# Patient Record
Sex: Female | Born: 1935
Health system: Southern US, Community
[De-identification: ages and names within clinical notes are randomized; demographics above are authoritative.]

## PROBLEM LIST (undated history)

## (undated) DIAGNOSIS — G2581 Restless legs syndrome: Secondary | ICD-10-CM

## (undated) DIAGNOSIS — I639 Cerebral infarction, unspecified: Secondary | ICD-10-CM

## (undated) DIAGNOSIS — M1990 Unspecified osteoarthritis, unspecified site: Secondary | ICD-10-CM

## (undated) DIAGNOSIS — K219 Gastro-esophageal reflux disease without esophagitis: Secondary | ICD-10-CM

## (undated) DIAGNOSIS — G459 Transient cerebral ischemic attack, unspecified: Secondary | ICD-10-CM

## (undated) DIAGNOSIS — E7800 Pure hypercholesterolemia, unspecified: Secondary | ICD-10-CM

## (undated) DIAGNOSIS — I10 Essential (primary) hypertension: Secondary | ICD-10-CM

## (undated) HISTORY — DX: Pure hypercholesterolemia, unspecified: E78.00

## (undated) HISTORY — DX: Restless legs syndrome: G25.81

## (undated) HISTORY — DX: Gastro-esophageal reflux disease without esophagitis: K21.9

## (undated) HISTORY — DX: Cerebral infarction, unspecified: I63.9

## (undated) HISTORY — PX: JOINT REPLACEMENT: SHX530

## (undated) HISTORY — DX: Essential (primary) hypertension: I10

## (undated) HISTORY — DX: Transient cerebral ischemic attack, unspecified: G45.9

## (undated) HISTORY — DX: Unspecified osteoarthritis, unspecified site: M19.90

---

## 2010-07-12 HISTORY — PX: TOTAL HIP ARTHROPLASTY: SHX124

## 2011-06-16 DIAGNOSIS — I639 Cerebral infarction, unspecified: Secondary | ICD-10-CM

## 2011-06-16 HISTORY — DX: Cerebral infarction, unspecified: I63.9

## 2011-06-19 ENCOUNTER — Encounter: Payer: Self-pay | Admitting: *Deleted

## 2011-06-19 ENCOUNTER — Inpatient Hospital Stay (HOSPITAL_COMMUNITY)
Admission: RE | Admit: 2011-06-19 | Discharge: 2011-07-03 | DRG: 945 | Disposition: A | Discharge status: Home Health | Payer: Medicare Other | Source: Ambulatory Visit | Attending: Physical Medicine & Rehabilitation | Admitting: Physical Medicine & Rehabilitation

## 2011-06-19 DIAGNOSIS — E785 Hyperlipidemia, unspecified: Secondary | ICD-10-CM

## 2011-06-19 DIAGNOSIS — M1990 Unspecified osteoarthritis, unspecified site: Secondary | ICD-10-CM

## 2011-06-19 DIAGNOSIS — E7800 Pure hypercholesterolemia, unspecified: Secondary | ICD-10-CM

## 2011-06-19 DIAGNOSIS — G2581 Restless legs syndrome: Secondary | ICD-10-CM

## 2011-06-19 DIAGNOSIS — Z5189 Encounter for other specified aftercare: Principal | ICD-10-CM

## 2011-06-19 DIAGNOSIS — N289 Disorder of kidney and ureter, unspecified: Secondary | ICD-10-CM

## 2011-06-19 DIAGNOSIS — R1310 Dysphagia, unspecified: Secondary | ICD-10-CM

## 2011-06-19 DIAGNOSIS — I6330 Cerebral infarction due to thrombosis of unspecified cerebral artery: Secondary | ICD-10-CM

## 2011-06-19 DIAGNOSIS — Z836 Family history of other diseases of the respiratory system: Secondary | ICD-10-CM

## 2011-06-19 DIAGNOSIS — Z8249 Family history of ischemic heart disease and other diseases of the circulatory system: Secondary | ICD-10-CM

## 2011-06-19 DIAGNOSIS — G4700 Insomnia, unspecified: Secondary | ICD-10-CM

## 2011-06-19 DIAGNOSIS — I639 Cerebral infarction, unspecified: Secondary | ICD-10-CM

## 2011-06-19 DIAGNOSIS — I6350 Cerebral infarction due to unspecified occlusion or stenosis of unspecified cerebral artery: Secondary | ICD-10-CM

## 2011-06-19 DIAGNOSIS — K219 Gastro-esophageal reflux disease without esophagitis: Secondary | ICD-10-CM

## 2011-06-19 DIAGNOSIS — I10 Essential (primary) hypertension: Secondary | ICD-10-CM

## 2011-06-19 DIAGNOSIS — G8190 Hemiplegia, unspecified affecting unspecified side: Secondary | ICD-10-CM

## 2011-06-19 LAB — URINALYSIS, ROUTINE W REFLEX MICROSCOPIC
Leukocytes, UA: NEGATIVE
Nitrite: NEGATIVE
Specific Gravity, Urine: 1.023 (ref 1.005–1.030)
Urobilinogen, UA: 0.2 mg/dL (ref 0.0–1.0)
pH: 5.5 (ref 5.0–8.0)

## 2011-06-19 LAB — MRSA PCR SCREENING: MRSA by PCR: NEGATIVE

## 2011-06-19 MED ORDER — PROMETHAZINE HCL 12.5 MG RE SUPP: 12.5000 mg | Freq: Four times a day (QID) | RECTAL | Status: DC | PRN

## 2011-06-19 MED ORDER — PANTOPRAZOLE SODIUM 40 MG PO TBEC
40.0000 mg | DELAYED_RELEASE_TABLET | Freq: Every day | ORAL | Status: DC
  Administered 2011-06-20 – 2011-07-03 (×14): 40 mg via ORAL
  Filled 2011-06-19 (×18): qty 1

## 2011-06-19 MED ORDER — FLEET ENEMA 7-19 GM/118ML RE ENEM
1.0000 | ENEMA | Freq: Once | RECTAL | Status: AC | PRN
  Filled 2011-06-19: qty 1

## 2011-06-19 MED ORDER — ACETAMINOPHEN 325 MG PO TABS
325.0000 mg | ORAL_TABLET | ORAL | Status: DC | PRN
Start: 2011-06-19 — End: 2011-07-03
  Administered 2011-06-22 – 2011-06-23 (×2): 650 mg via ORAL
  Filled 2011-06-19 (×3): qty 2

## 2011-06-19 MED ORDER — PROMETHAZINE HCL 12.5 MG PO TABS: 12.5000 mg | ORAL_TABLET | Freq: Four times a day (QID) | ORAL | Status: DC | PRN

## 2011-06-19 MED ORDER — METOPROLOL TARTRATE 25 MG/10 ML ORAL SUSPENSION
50.0000 mg | Freq: Every day | ORAL | Status: DC
  Filled 2011-06-19: qty 20

## 2011-06-19 MED ORDER — MUSCLE RUB 10-15 % EX CREA
TOPICAL_CREAM | CUTANEOUS | Status: DC | PRN
  Filled 2011-06-19: qty 85

## 2011-06-19 MED ORDER — LISINOPRIL 20 MG PO TABS
20.0000 mg | ORAL_TABLET | Freq: Every day | ORAL | Status: DC
  Administered 2011-06-20 – 2011-07-02 (×13): 20 mg via ORAL
  Filled 2011-06-19 (×16): qty 1

## 2011-06-19 MED ORDER — FOOD THICKENER (THICKENUP CLEAR)
2.0000 | ORAL | Status: DC | PRN
  Filled 2011-06-19: qty 2.8

## 2011-06-19 MED ORDER — SIMVASTATIN 20 MG PO TABS
20.0000 mg | ORAL_TABLET | Freq: Every day | ORAL | Status: DC
  Filled 2011-06-19: qty 1

## 2011-06-19 MED ORDER — TRAZODONE HCL 50 MG PO TABS
25.0000 mg | ORAL_TABLET | Freq: Every evening | ORAL | Status: DC | PRN
  Administered 2011-06-20 – 2011-06-23 (×3): 25 mg via ORAL
  Administered 2011-06-24: 50 mg via ORAL
  Filled 2011-06-19 (×4): qty 1

## 2011-06-19 MED ORDER — BISACODYL 10 MG RE SUPP: 10.0000 mg | Freq: Every day | RECTAL | Status: DC | PRN

## 2011-06-19 MED ORDER — GUAIFENESIN-DM 100-10 MG/5ML PO SYRP: 5.0000 mL | ORAL_SOLUTION | Freq: Four times a day (QID) | ORAL | Status: DC | PRN

## 2011-06-19 MED ORDER — AZELASTINE HCL 0.1 % NA SOLN
1.0000 | Freq: Two times a day (BID) | NASAL | Status: DC
  Administered 2011-06-20 – 2011-07-02 (×24): 1 via NASAL
  Filled 2011-06-19: qty 30

## 2011-06-19 MED ORDER — CLOPIDOGREL BISULFATE 75 MG PO TABS
75.0000 mg | ORAL_TABLET | Freq: Every day | ORAL | Status: DC
  Administered 2011-06-20 – 2011-07-03 (×14): 75 mg via ORAL
  Filled 2011-06-19 (×15): qty 1

## 2011-06-19 MED ORDER — POLYETHYLENE GLYCOL 3350 17 G PO PACK
17.0000 g | PACK | Freq: Every day | ORAL | Status: DC | PRN
  Filled 2011-06-19: qty 1

## 2011-06-19 MED ORDER — ALUM & MAG HYDROXIDE-SIMETH 200-200-20 MG/5ML PO SUSP: 30.0000 mL | ORAL | Status: DC | PRN

## 2011-06-19 MED ORDER — BIOTENE DRY MOUTH MT LIQD
15.0000 mL | Freq: Two times a day (BID) | OROMUCOSAL | Status: DC
  Administered 2011-06-19 – 2011-07-03 (×23): 15 mL via OROMUCOSAL

## 2011-06-19 MED ORDER — ALUM & MAG HYDROXIDE-SIMETH 400-400-40 MG/5ML PO SUSP: 30.0000 mL | ORAL | Status: DC | PRN

## 2011-06-19 MED ORDER — SENNA 8.6 MG PO TABS
2.0000 | ORAL_TABLET | Freq: Every day | ORAL | Status: DC
  Administered 2011-06-20 – 2011-07-02 (×13): 17.2 mg via ORAL
  Filled 2011-06-19 (×14): qty 2

## 2011-06-19 MED ORDER — SIMVASTATIN 40 MG PO TABS
40.0000 mg | ORAL_TABLET | Freq: Every day | ORAL | Status: DC
  Administered 2011-06-19 – 2011-07-02 (×14): 40 mg via ORAL
  Filled 2011-06-19 (×15): qty 1

## 2011-06-19 MED ORDER — ROPINIROLE HCL 0.25 MG PO TABS
0.2500 mg | ORAL_TABLET | Freq: Every evening | ORAL | Status: DC | PRN
  Administered 2011-06-19: 0.25 mg via ORAL
  Filled 2011-06-19: qty 1

## 2011-06-19 MED ORDER — ENOXAPARIN SODIUM 40 MG/0.4ML ~~LOC~~ SOLN
40.0000 mg | SUBCUTANEOUS | Status: DC
  Administered 2011-06-19 – 2011-07-02 (×14): 40 mg via SUBCUTANEOUS
  Filled 2011-06-19 (×15): qty 0.4

## 2011-06-19 MED ORDER — ROPINIROLE HCL 0.25 MG PO TABS
0.2500 mg | ORAL_TABLET | Freq: Every day | ORAL | Status: DC
  Filled 2011-06-19: qty 1

## 2011-06-19 MED ORDER — PROMETHAZINE HCL 25 MG/ML IJ SOLN: 12.5000 mg | Freq: Four times a day (QID) | INTRAMUSCULAR | Status: DC | PRN

## 2011-06-19 MED ORDER — HYDROCHLOROTHIAZIDE 12.5 MG PO CAPS
12.5000 mg | ORAL_CAPSULE | Freq: Every day | ORAL | Status: DC
  Administered 2011-06-20 – 2011-07-03 (×14): 12.5 mg via ORAL
  Filled 2011-06-19 (×16): qty 1

## 2011-06-19 MED ORDER — DIPHENHYDRAMINE HCL 12.5 MG/5ML PO ELIX
12.5000 mg | ORAL_SOLUTION | Freq: Four times a day (QID) | ORAL | Status: DC | PRN
  Filled 2011-06-19: qty 10

## 2011-06-19 NOTE — PMR Pre-admission (Signed)
[facility] PMR Admission Coordinator Pre-Admission Assessment  Patient Details Name: Kimberly Lester Date of Birth: Nov 09, 1930 Height:  5\' 2"  (157.5 cm) Weight:  68.493 kg (151 lb)  Insurance Information: HMO:X    PRIMARY:AARP MCR Complete      Policy#:[id number]      Subscriber:patient CM Name:Kimberly Lester      Phone#:[phone]     UJW#:[phone] Pre-Cert#:[id number]      Employer:Retired Benefits:  Phone #[phone]     Name:melinda Eff. Date:05/21/11     Deduct:0      Out of Pocket Max:$3900/ $0 met      Life XLK:GMWNU CIR:$220/day 1-7; $0/day 8-      SNF:$50/day 1-20; $150/day 21-40; $0/day 41-100 Outpatient:     Co-Pay:$40/visit Home Health:!00%      Co-Pay:0% DME:80%     Co-Pay:20% Providers:in network   Patients Current Diet:  Mechanical Soft; nectar thick liquids  Current Medical History:   Patient Admitting Diagnosis: L-CVA History of Present Illness: 78yo admitted to [facility] on 06/16/11 after on and off 2 day history of mild-right sided weakness with significant dysarthria and difficulty walking. She has a history of a right hip replacement done on 07/12/10 at [facility] in [place]. Patient admitted with diagnoses of TIA. After CT and MRI done, patient positive for a Left non hemorrhagic paracentral pontine infarct. Carotid ultrasound shows bilateral carotid bifurcation and proximal ICA plaque resulting in less than 50% diameter stenosis. Patient with dysphagia noted and MBSS done by SLP on 06/18/11 with recommendation for Mechanical soft and nectar thick liquids. Patient continues to work with therapies. NIH Stroke scale: unknown at outside hospital   Prior Rehab/Hospitalizations: none per patient   Medications   Current Medications: No current outpatient prescriptions on file.  Past Medical History: Significant for HTN, hypercholesterolemia, GERD and osteoarthritis of the digits. Also has had recent R-THR on 07/12/10 Family History:  mother positive for tuberculosis (deceased), father positive for emphysema and CAD  Precautions/Special Needs:   Precautions/Special Needs: Swallowing Swallowing Precautions: dysphagia  (MBSS done 06/18/11) Other Precautions: dysarthria Conditions/Impairments that will impact rehabilitation: Balance;Coordination;Dizziness;Other Balance Conditions/Impairments: lean noted with activity Coordination Conditions/Impairments: right sided weakness Dizziness Conditions/Impairments: when gettting up; history of vertigo Other Conditions/Impairments: dysphagia; dysarthria  Home Living:   Lives With: Spouse Receives Help From: Family Type of Home: House Home Layout: Two level;Full bath on main level;Able to live on main level with bedroom/bathroom Home Access: Stairs to enter Entrance Stairs-Rails: None Entrance Stairs-Number of Steps: 2 Bathroom Shower/Tub: Health visitor: Standard Bathroom Accessibility: Yes How Accessible: Accessible via walker Home Adaptive Equipment: Bedside commode/3-in-1;Walker - rolling  Prior Function:  Level of Independence: Independent with basic ADLs;Independent with gait;Independent with transfers Able to Take Stairs?: Yes Driving: Yes Vocation: Retired   Prior 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 Devices/Equipment:   Home Assistive Devices/Equipment: Environmental consultant (specify type)  Discharge Planning: Living Arrangements: Spouse/significant other Support Systems: Spouse/significant other;Children;Family members Assistance Needed: assistance at discharge Do you have any problems obtaining your medications?: No Type of Residence: Private residence Patient expects to be discharged to:: home Expected Discharge Date:  (to be determined) Case Management Consult Needed: Yes (Comment)   Prior Functional Level Current Functional Level  Bed Mobility  Independent  Min A   Transfers   Independent  Mod A   Mobility - Walk/Wheelchair  Independent  Min A RW 52ft x 2   Upper Body Dressing  Independent  min a  Lower Body Dressing  Independent  mod A   Grooming  Independent  set up   Eating/Drinking  Independent  set up   Toilet Transfer  Independent  Mod A   Bladder Continence   Independent  Independent   Bowel Management  Independent  Last BM 06/19/11   Stair Climbing  Independent  not assessed   Communication  Independent  mild dysarthria   Memory  Independent  WNL   Cooking/Meal Prep  Independent    Housework  Independent    Money Management  Independent    Driving  Independent     Prior Functional Levels:  Bed Mobility: Independent Transfers: Independent Mobility - Walk/Wheelchair: Independent Upper Body Dressing: Independent Lower Body Dressing: Independent Grooming: Independent Eating/Drinking: Independent Toilet Transfer: Independent Bladder Continence: Independent Bowel Management: Independent Stair Climbing: Independent Communication: Independent Memory: Independent Cooking/Meal Prep: Independent Housework: Independent Money Management: Independent Driving: Independent  Current Functional Levels: Bed Mobility: Min A Transfers: Mod A Mobility - Walk/Wheelchair: Min A RW 35ft x 2 Upper Body Dressing: min a Lower Body Dressing: mod A Grooming: set up Eating/Drinking: set up Toilet Transfer: Mod A Bladder Continence: Independent Bowel Management: Last BM 06/19/11 Stair Climbing: not assessed Communication: mild dysarthria Memory: WNL  Previous Home Environment:  Living Arrangements: Spouse/significant other Support Systems: Spouse/significant other;Children;Family members Assistance Needed: assistance at discharge Do you have any problems obtaining your medications?: No Type of Residence: Private residence Patient expects to be discharged to:: home Expected Discharge Date:  (to be determined) Home  Environment Number of Levels: 2 Previous Home Environment Number of Steps: 2 Previous Home Environment Is Bedroom on Main Floor?: Yes Previous Home Environment Is Bathroom on Main Floor?: Yes  Discharge Living Setting:  Plans for Discharge Living Setting: Patient's home;Lives with (comment);House (spouse) Discharge Living Setting Number of Levels: 2 Discharge Living Setting Number of Steps: 2 Discharge Living Setting is Bedroom on Main Floor?: Yes Discharge Living Setting is Bathroom on Main Floor?: Yes  Social/Family/Support Systems:   Patient Roles: Spouse;Parent;Caregiver Anticipated Caregiver: husband and daughters Anticipated Caregiver's Contact Information: Junious Silk [phone] cell, [phone] home Michaelle Copas [phone] cell,  home [phone]) Ability/Limitations of Caregiver: husband limited by COPD, dtr and granddtr to assist PRN Caregiver Availability: 24/7 Discharge Plan Discussed with Primary Caregiver: Yes Is Caregiver In Agreement with Plan?: Yes Does Caregiver/Family have Issues with Lodging/Transportation while Pt is in Rehab?: No  Goals/Additional Needs:  Patient/Family Goal for Rehab: to be home again as independent as possible Cultural Considerations: none Dietary Needs: Mechanical Soft Nectar thick Equipment Needs: to be determined Pt/Family Agrees to Admission and willing to participate: Yes Program Orientation Provided & Reviewed with Pt/Caregiver Including Roles  & Responsibilities: Yes  Preadmission Screen Completed By:  Oletta Darter, 06/19/2011 12:58 PM  Preadmission Screen Completed by Toni Amend on 06/19/11 @ 1315.      Discussed status with Dr. Riley Kill on1/30/13 at 0800 (time/date) and received telephone approval for admission today.  Admission Coordinator:  Oletta Darter, time 8657 /Date1/30/13  Co-signed by:    Marland Kitchen

## 2011-06-19 NOTE — Plan of Care (Addendum)
Overall Plan of Care [facility]) Patient Details Name: Selina Tapper MRN: [id number] DOB: 01/19/1931  Diagnosis:  Rehab for CVA  Primary Diagnosis:  L pontine infarct with R spastic hemi   Co-morbidities: HTN,dyslipidemia  Functional Problem List  Patient demonstrates impairments in the following areas: Balance, Bladder, Bowel, Edema, Endurance, Medication Management, Motor, Nutrition, Pain, Safety and Skin Integrity  Basic ADL's: grooming, bathing, dressing and toileting Advanced ADL's: simple meal preparation  Transfers:  bed mobility, bed to chair, car and furniture Locomotion:  ambulation and stairs  Additional Impairments:  Functional use of upper extremity  Anticipated Outcomes Item Anticipated Outcome  Eating/Swallowing  Modified independent  Basic self-care  Occasional minimal assistance with two handed tasks, eg socks, bra  Tolieting  Mod I  Bowel/Bladder  Continent of bowel and bladder  Transfers  Mod I  Locomotion  supervision  Communication    Cognition    Pain  3 or less  Safety/Judgment  supervision  Other     Therapy Plan: PT Frequency: 1-2 X/day, 60-90 minutes 0T Frequency: 1-2 X/day, 60-90 minutes  SLP 1-2X/day, 60-90 minutes  Team Interventions: Item RN PT OT SLP SW TR Other  Self Care/Advanced ADL Retraining   X      Neuromuscular Re-Education  x X      Therapeutic Activities  x X      UE/LE Strength Training/ROM  x X      UE/LE Coordination Activities  x X      Visual/Perceptual Remediation/Compensation         DME/Adaptive Equipment Instruction  x X      Therapeutic Exercise  x X      Balance/Vestibular Training  x X      Patient/Family Education x x X x     Cognitive Remediation/Compensation         Functional Mobility Training  x X      Ambulation/Gait Training  x       Air traffic controller x   x     Speech/Language Facilitation         Bladder Management x        Bowel Management x        Disease Management/Prevention x        Pain Management x x       Medication Management x        Skin Care/Wound Management x        Splinting/Orthotics   X      Discharge Planning x  X  x    Psychosocial Support     x                       Team Discharge Planning: Destination:  Home Projected Follow-up:  PT, OT and Outpatient none for SLP Projected Equipment Needs:  Bedside Commode, Scientist, physiological, Patient/family involved in discharge planning:  Yes  MD ELOS: 10 Days Medical Rehab Prognosis:  Excellent Assessment: 78 yo female admitted for L pontine infarct causing R spastic Hemi and dysarthria, requires CIR level PT,OT,SLP, 24/7 Rehab Rn and daily MD visits to maximize recovery and minimize morbidity.

## 2011-06-19 NOTE — Progress Notes (Signed)
Pt admitted to from 4147 from [facility] via EMS accompanied by family shortly thereafter via private vehicle. Pt and family members oriented to rehab, bed controls, call light, rehab routine, white board, therapy schedule, stroke notebook for education, lead nursing, Safety video and plan, swallowing precautions, nectar thick liquids, must be supervised by staff only until seen by therapy,team conference on Wednesday, preferred name and butterfly wishes, please see CHL for details of assessment, MRSA PCR sent, placed on contact until results back, pt and family aware need U/A and C& S. To call when needs to void. Will continue to monitor. Roberts-VonCannon, Brandice Busser Elon Jester

## 2011-06-19 NOTE — H&P (Signed)
Physical Medicine and Rehabilitation Admission H&   Chief complaint: Slurred speech and right sided weakness.  HPI: Ms Kimberly Lester is a 78 year old right handed female with history of HTN, dyslipidemia, admitted to [facility] on 01/27 with slurred speech and right sided weakness with difficulty walking.  MRI brain done revealing acute non hemorrhagic left pontine infarct. Carotid dopplers with bilateral carotid bifurcation and proximal ICA plaque with less than 50% stenosis.  Patient placed on plavix for cva prophylaxis. Patient continues with slurred speech and right sided weakness-UE >LE. Blood pressures have been labile.   Review of Systems  HENT: Negative for hearing loss.   Eyes: Negative for blurred vision.  Respiratory: Negative for sputum production and shortness of breath.   Cardiovascular: Negative for chest pain.  Gastrointestinal: Positive for constipation. Negative for heartburn and nausea.  Genitourinary: Negative for urgency and frequency.  Musculoskeletal: Positive for myalgias.  Neurological: Positive for speech change and focal weakness. Negative for headaches.  Psychiatric/Behavioral: The patient has insomnia.        Due to restless legs.   Past Medical History  Diagnosis Date  . Osteoarthritis   . Restless leg syndrome   . Hypertension   . GERD (gastroesophageal reflux disease)   . Hypercholesterolemia    Past Surgical History  Procedure Date   C-Section   . Total hip arthroplasty 07/12/10    at [facility]   Family History  Problem Relation Age of Onset  . Tuberculosis Mother   . COPD Father   . Coronary artery disease Father    Social History:  Married. Independent and active PTA.  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 She oes not have a smoking history on file. She does not have any smokeless tobacco history on file. Does not use alcohol or illicit drugs.  Allergies:  Allergies  Allergen Reactions  . Ativan  Other (See Comments)    "makes her crazy"  . Percocet (Oxycodone-Acetaminophen) Nausea And Vomiting    No current facility-administered medications on file as of .    Outpatient prescriptions: Steroid nasal spray Aleve daily Lisinopril-Hctz 10/12.5 -daily Lopressor 50 mg - daily Omeprazole 40 mg - daily Simvastatin 40 mg - daily     Home:  One level home with 2 STE.   Functional History: Independent without assistive device.   Functional Status:  Mobility:  Min-mod assist for transfers Tendency to lean to the right and difficulty advancing RLE Min-mod assist for standing balance CGA-Min assist for ambulating 40 feet x 2 with rest breaks and unsteady gait.     ADL:  Requires increased time to utilize RUE with impaired fine motor coordination.   Cognition:     Labs:  ANA: NR    RPR: NR  HTSH:1.95  Chol: 169    LDL: 83   Trig: 250   H/H: 12.3/35.8    Na: 138  K:3.8   Cl:101  CO2: 28   BUN:10   Creat: 0.59  Glucose:94  Blood pressure 153/83, pulse 80, temperature 98.6 F (37 C), temperature source Oral, resp. rate 22, height 5\' 2"  (1.575 m), weight 68.8 kg (151 lb 10.8 oz), SpO2 94.00%.  Physical Exam  Constitutional: She is oriented to person, place, and time. She appears well-developed and well-nourished.  HENT:  Head: Normocephalic and atraumatic.  Eyes: Pupils are equal, round, and reactive to light.  Neck: Normal range of motion. Neck supple.  Cardiovascular: Normal rate  and regular rhythm.   Respiratory: Effort normal and breath sounds normal.  GI: Soft. Bowel sounds are normal.  Musculoskeletal: Normal range of motion.  Neurological: She is alert and oriented to person, place, and time.       Oriented to self, place, situation.  Follows commands without difficulties.  Speech with some hesitation and mild slurring occasionally. Right upper extremity strength is 2+ to 3 minus out of 5 proximally to 3+ out of 5 distally. Right lower extremity is to 2+  proximal to 3+/4/5 distally. Left upper lower extremity grossly 5 out of 5. No gross sensory deficits. Reflexes 1+. She may have a mild right central seventh but this is minimal. His good oral motor control. Obviously she is appropriate with good insight and awareness. She follows all simple commands. Memory is good.    No results found.  Post Admission Physician Evaluation: 1. Functional deficits secondary  to left pontine infarct. 2. Patient is admitted to receive collaborative, interdisciplinary care between the physiatrist, rehab nursing staff, and therapy team. 3. Patient's level of medical complexity and substantial therapy needs in context of that medical necessity cannot be provided at a lesser intensity of care such as a SNF. 4. Patient has experienced substantial functional loss from his/her baseline which was documented above under the "Functional History" and "Functional Status" headings.  Judging by the patient's diagnosis, physical exam, and functional history, the patient has potential for functional progress which will result in measurable gains while on inpatient rehab.  These gains will be of substantial and practical use upon discharge  in facilitating mobility and self-care at the household level. 5. Physiatrist will provide 24 hour management of medical needs as well as oversight of the therapy plan/treatment and provide guidance as appropriate regarding the interaction of the two. 6. 24 hour rehab nursing will assist with bladder management, bowel management, safety, skin/wound care, disease management, medication administration and patient education  and help integrate therapy concepts, techniques,education, etc. 7. PT will assess and treat for:  Lower extremity strength, gait, and her muscular reeducation, adaptive equipment.  Goals are: Modified independent to supervision. 8. OT will assess and treat for: Upper extremity strength, ADLs, neuromuscular reeducation, ADLs,  functional mobility.   Goals are: Modified independent to supervision. 9. SLP will assess and treat for: Speech intelligibility.  Goals are: Modified independent. 10. Case Management and Social Worker will assess and treat for psychological issues and discharge planning. 11. Team conference will be held weekly to assess progress toward goals and to determine barriers to discharge. 12.  Patient will receive at least 3 hours of therapy per day at least 5 days per week. 13. ELOS and Prognosis: 10 days excellent   Medical Problem List and Plan: 1.   DVT Prophylaxis/Anticoagulation: Pharmaceutical: Lovenox  2.  Pain Management: N/A.  Use aleve for arthritic pain.  Will use tylenol prn.  3.  Mood: family reports patient worries about a lot of things.  Currently seems to be handling things well.  Will monitor and have SW follow up for formal evaluation.  4.  HTN: Monitor with bid checks.  Has been labile per reports.  Continue current regimen of   5.  Dyslipidemia: continue zocor.  Will check LFTs in am.  6.  RLS:  Will add reqip.  Family reports it helped after hip surgery but prescription was never refilled.  7. GERD: Continue PPI.       Ivory Broad, MD 06/19/11  1750

## 2011-06-20 DIAGNOSIS — I6330 Cerebral infarction due to thrombosis of unspecified cerebral artery: Secondary | ICD-10-CM

## 2011-06-20 DIAGNOSIS — Z5189 Encounter for other specified aftercare: Secondary | ICD-10-CM

## 2011-06-20 LAB — COMPREHENSIVE METABOLIC PANEL
AST: 16 U/L (ref 0–37)
Alkaline Phosphatase: 68 U/L (ref 39–117)
BUN: 24 mg/dL — ABNORMAL HIGH (ref 6–23)
CO2: 25 mEq/L (ref 19–32)
Chloride: 106 mEq/L (ref 96–112)
Creatinine, Ser: 0.61 mg/dL (ref 0.50–1.10)
GFR calc non Af Amer: 85 mL/min — ABNORMAL LOW (ref >90)
Potassium: 3.7 mEq/L (ref 3.5–5.1)
Total Bilirubin: 0.4 mg/dL (ref 0.3–1.2)

## 2011-06-20 LAB — CBC
MCH: 30 pg (ref 26.0–34.0)
MCV: 88.5 fL (ref 78.0–100.0)
Platelets: 160 10*3/uL (ref 150–400)
RBC: 4.17 MIL/uL (ref 3.87–5.11)
RDW: 13 % (ref 11.5–15.5)

## 2011-06-20 LAB — DIFFERENTIAL
Basophils Absolute: 0 10*3/uL (ref 0.0–0.1)
Basophils Relative: 0 % (ref 0–1)
Eosinophils Absolute: 0.1 10*3/uL (ref 0.0–0.7)
Eosinophils Relative: 3 % (ref 0–5)
Lymphs Abs: 1.2 10*3/uL (ref 0.7–4.0)
Neutrophils Relative %: 62 % (ref 43–77)

## 2011-06-20 MED ORDER — ROPINIROLE HCL 0.5 MG PO TABS
0.5000 mg | ORAL_TABLET | Freq: Every day | ORAL | Status: DC
  Administered 2011-06-20 – 2011-07-02 (×13): 0.5 mg via ORAL
  Filled 2011-06-20 (×14): qty 1

## 2011-06-20 MED ORDER — METOPROLOL TARTRATE 50 MG PO TABS
50.0000 mg | ORAL_TABLET | Freq: Every day | ORAL | Status: DC
  Administered 2011-06-20 – 2011-07-02 (×13): 50 mg via ORAL
  Filled 2011-06-20 (×14): qty 1

## 2011-06-20 NOTE — Progress Notes (Signed)
INITIAL ADULT NUTRITION ASSESSMENT Date: 06/20/2011   Time: 8:53 AM  Reason for Assessment: Dysphagia  ASSESSMENT: Female 78 y.o.  Dx: s/p CVA  Hx:  Past Medical History  Diagnosis Date  . Osteoarthritis   . Restless leg syndrome   . Hypertension   . GERD (gastroesophageal reflux disease)   . Hypercholesterolemia    Related Meds:     . antiseptic oral rinse  15 mL Mouth Rinse BID  . azelastine  1 spray Each Nare BID  . clopidogrel  75 mg Oral Q breakfast  . enoxaparin  40 mg Subcutaneous Q24H  . hydrochlorothiazide  12.5 mg Oral Daily  . lisinopril  20 mg Oral Daily  . metoprolol tartrate  50 mg Oral Daily  . pantoprazole  40 mg Oral Q1200  . senna  2 tablet Oral QHS  . simvastatin  40 mg Oral q1800  . DISCONTD: metoprolol tartrate  50 mg Oral Daily  . DISCONTD: rOPINIRole  0.25 mg Oral QHS  . DISCONTD: simvastatin  20 mg Oral q1800   Ht: 5\' 2"  (157.5 cm)  Wt: 151 lb 10.8 oz (68.8 kg)  Ideal Wt: 50 kg % Ideal Wt: 138%  Usual Wt: unable to obtain % Usual Wt: n/a  Body mass index is 27.74 kg/(m^2). Pt meets criteria for overweight.  Food/Nutrition Related Hx: Regular diet PTA  Labs:  CMP     Component Value Date/Time   NA 142 06/20/2011 0600   K 3.7 06/20/2011 0600   CL 106 06/20/2011 0600   CO2 25 06/20/2011 0600   GLUCOSE 106* 06/20/2011 0600   BUN 24* 06/20/2011 0600   CREATININE 0.61 06/20/2011 0600   CALCIUM 9.5 06/20/2011 0600   PROT 6.4 06/20/2011 0600   ALBUMIN 3.4* 06/20/2011 0600   AST 16 06/20/2011 0600   ALT 18 06/20/2011 0600   ALKPHOS 68 06/20/2011 0600   BILITOT 0.4 06/20/2011 0600   GFRNONAA 85* 06/20/2011 0600   GFRAA >90 06/20/2011 0600   Intake/Output: I/O last 3 completed shifts: In: 840 [P.O.:840] Out: 500 [Urine:500]    Diet Order: Dysphagia 3, Nectar Thick Liquids  Supplements/Tube Feeding: none  IVF:    Estimated Nutritional Needs:   Kcal: 1450 - 1600 Protein:  75 - 85 grams Fluid:  1.5 - 1.7 L/d  Pt admitted from [facility], s/p CVA. Currently on Dysphagia 3 diet with Nectar Thickened liquids. Consumed 80% of dinner last evening. Eating well at this time. Pt denies any issues with appetite or recent wt loss. Family states that pt is eating less than she was at home, but pt states that she's eating until she is full and feels as though she is getting enough to eat. Discussed importance of hydration, pt states that she enjoys buttermilk.  NUTRITION DIAGNOSIS: -Swallowing difficulty (NI-1.1).  Status: Ongoing  RELATED TO: recent CVA  AS EVIDENCE BY: need for thickened liquids.  MONITORING/EVALUATION(Goals): Goal: Pt to consume >/= 90% of estimated needs with meals and supplements. Monitor: PO intake, weights, labs, I/O's, diet advancement  EDUCATION NEEDS: -No education needs identified at this time  INTERVENTION: 1. Pt eating well at this time. Discussed importance of hydration. Pt asked appropriate questions. 2. RD to follow nutrition care plan  Dietitian #: [phone]  DOCUMENTATION CODES Per approved criteria  -Not Applicable    Adair Laundry 06/20/2011, 8:53 AM

## 2011-06-20 NOTE — Progress Notes (Signed)
Subjective/Complaints: Legs kept her up last night. No complaints of SOB,CP,ABD pain.  Glad to be on water protocol.  Objective: Vital Signs: Blood pressure 144/83, pulse 76, temperature 98.5 F (36.9 C), temperature source Oral, resp. rate 19, height 5\' 2"  (1.575 m), weight 68.8 kg (151 lb 10.8 oz), SpO2 93.00%. No results found.  Basename 06/20/11 0600  WBC 4.9  HGB 12.5  HCT 36.9  PLT 160    Basename 06/20/11 0600  NA 142  K 3.7  CL 106  CO2 25  GLUCOSE 106*  BUN 24*  CREATININE 0.61  CALCIUM 9.5   CBG (last 3)  No results found for this basename: GLUCAP:3 in the last 72 hours  Wt Readings from Last 3 Encounters:  06/19/11 68.8 kg (151 lb 10.8 oz)  06/19/11 68.493 kg (151 lb)    Physical Exam:  General appearance: alert, cooperative and no distress Head: Normocephalic, without obvious abnormality, atraumatic Eyes: negative findings: lids and lashes normal Ears: normal TM and external ear canal right ear Nose: Nares normal. Septum midline. Mucosa normal. No drainage or sinus tenderness. Throat: normal findings: lips normal without lesions Neck: no adenopathy, no JVD and supple, symmetrical, trachea midline Back: no kyphosis present, symmetric, no curvature. ROM normal. No CVA tenderness. Resp: clear to auscultation bilaterally Cardio: regular rate and rhythm GI: soft, non-tender; bowel sounds normal; no masses,  no organomegaly Extremities: extremities normal, atraumatic, no cyanosis or edema Pulses: 2+ and symmetric Skin: Skin color, texture, turgor normal. No rashes or lesions Neurologic:    Assessment/Plan: 1. Functional deficits secondary to L pontine infarct with R hemiparesis which require 3+ hours per day of interdisciplinary therapy in a comprehensive inpatient rehab setting. Physiatrist is providing close team supervision and 24 hour management of active medical problems listed below. Physiatrist and rehab team continue to assess barriers to  discharge/monitor patient progress toward functional and medical goals. FIM: FIM - Bathing Bathing: 0: Activity did not occur  FIM - Upper Body Dressing/Undressing Upper body dressing/undressing: 0: Wears gown/pajamas-no public clothing FIM - Lower Body Dressing/Undressing Lower body dressing/undressing: 0: Wears gown/pajamas-no public clothing  FIM - Toileting Toileting steps completed by patient: Adjust clothing prior to toileting;Adjust clothing after toileting (gown) Toileting: 3: Mod-Patient completed 2 of 3 steps (assisted by grandaughter)  FIM - Diplomatic Services operational officer Devices: Bedside commode Toilet Transfers: 3-To toilet/BSC: Mod A (lift or lower assist)  FIM - Games developer Transfer: 0: Activity did not occur  FIM - Locomotion: Wheelchair Locomotion: Wheelchair: 0: Activity did not occur FIM - Locomotion: Ambulation Locomotion: Ambulation: 0: Activity did not occur  Comprehension Comprehension Mode: Auditory Comprehension: 5-Understands complex 90% of the time/Cues < 10% of the time  Expression Expression Mode: Verbal Expression: 5-Expresses complex 90% of the time/cues < 10% of the time  Social Interaction Social Interaction: 6-Interacts appropriately with others with medication or extra time (anti-anxiety, antidepressant).  Problem Solving Problem Solving: 5-Solves basic 90% of the time/requires cueing < 10% of the time  Memory Memory: 5-Recognizes or recalls 90% of the time/requires cueing < 10% of the time  Medical Problem List and Plan:  1. DVT Prophylaxis/Anticoagulation: Pharmaceutical: Lovenox   2. Pain Management: N/A. Use aleve for arthritic pain. Will use tylenol prn.   3. Mood: family reports patient worries about a lot of things. Currently seems to be handling things well. Will monitor and have SW follow up for formal evaluation.   4. HTN: Monitor with bid checks. Has been labile per reports.  Continue current  regimen of metoprolol, prinizide,  5. Dyslipidemia: continue zocor. Will check LFTs in am.   6. RLS: Increase  reqip. Family reports it helped after hip surgery but prescription didn't have refills.  7. GERD: Continue PPI.   LOS (Days) 1 A FACE TO FACE EVALUATION WAS PERFORMED  Love, Evlyn Kanner 06/20/2011, 10:34 AM

## 2011-06-20 NOTE — Progress Notes (Signed)
Physical Therapy Session Note  Patient Details  Name: Lashundra Shiveley MRN: [id number] Date of Birth: [birth year]  Today's Date: 06/20/2011 Time: 1600-1630 Time Calculation (min): 30 min  Precautions: Precautions Precautions: Fall Precaution Comments: Dys. 3 with nectar-thick liquids, right hemiplegia Required Braces or Orthoses: No Restrictions Weight Bearing Restrictions: No  Short Term Goals: PT Short Term Goal 1: Pt will maintain dynamic standing balance with min A for functional task PT Short Term Goal 1 - Progress: Progressing toward goal PT Short Term Goal 2: Pt will gait with LRAD 100' with min A in controlled environment PT Short Term Goal 2 - Progress: Progressing toward goal PT Short Term Goal 3: Pt will negotiate 2 stairs without handrails for home entry with mod A PT Short Term Goal 3 - Progress: Progressing toward goal  Skilled Therapeutic Interventions/Progress Updates:   General Chart Reviewed: Yes Family/Caregiver Present: Yes (dtr)  Pain Pain Assessment Pain Assessment: No/denies pain  Other Treatments  Standing dynamic pre-gait activities with mirror for visual feedback including lateral weight shifts, and stepping forward, backward, and sideways with left LE, focus on increased right hip and knee extension and increased right weight shift for sustained right stance components. Progressed to gait 50 feet x 2 with right hand held assist mod assist, manual facilitation at right hip to increase hip extension and anterior right weight shift in initial to mid stance phase.  Therapy/Group: Individual Therapy  Romeo Rabon 06/20/2011, 5:20 PM

## 2011-06-20 NOTE — Evaluation (Addendum)
Clinical/Bedside Swallow Evaluation Patient Details  Name: Kimberly Lester MRN: [id number] DOB: Oct 27, 1930 Today's Date: 06/20/2011 Time: 8119-1478  Past Medical History:  Past Medical History  Diagnosis Date  . Osteoarthritis   . Restless leg syndrome   . Hypertension   . GERD (gastroesophageal reflux disease)   . Hypercholesterolemia    Past Surgical History:  Past Surgical History  Procedure Date  . Total hip arthroplasty 07/12/10    at [place] [facility] [place] [facility]   HPI:  MBSS 06/18/11 at [facility]; Patient transferred to [facility] 06/19/11    Assessment/Recommendations/Treatment Plan  SLP Assessment Clinical Impression Statement: Patient presents with mild oral motor weakness and suspected pharyngeal weakness resulting in penetration and suspected aspiration, due to significance of coughing episode resulting in watery eyes, of thin liquids and prolonged mastication of regular textures.    Risk for Aspiration: Mild Other Related Risk Factors: History of GERD  Swallow Evaluation Recommendations Solid Consistency: Dysphagia 3 (Mechanical soft) Liquid Consistency: Nectar Liquid Administration via: Cup;No straw Medication Administration: Whole meds with puree Supervision: Patient able to self feed;Intermittent supervision to cue for compensatory strategies Compensations: Slow rate;Small sips/bites Postural Changes and/or Swallow Maneuvers: Seated upright 90 degrees Oral Care Recommendations: Oral care BID Other Recommendations: Order thickener from pharmacy;Prohibited food (jello, ice cream, thin soups);Remove water pitcher;Other (Comment) (water protocol) Follow up Recommendations: Other (comment) (TBD)  Treatment Plan Treatment Plan Recommendations: Therapy as outlined in treatment plan below Speech Therapy Frequency: min 5x/week Treatment Duration: 2 weeks Interventions: Aspiration precaution training;Compensatory techniques;Patient/family education;Trials of  upgraded texture/liquids;Diet toleration management by SLP;Group dysphagia treatment  Prognosis Prognosis for Safe Diet Advancement: Good  Individuals Consulted Consulted and Agree with Results and Recommendations: Patient;Family member/caregiver Family Member Consulted: husband  Swallowing Goals  SLP Swallowing Goals Patient will consume recommended diet without observed clinical signs of aspiration with: Modified independent assistance Patient will utilize recommended strategies during swallow to increase swallowing safety with: Modified independent assistance  Swallow Study Prior Functional Status  Type of Home: House Lives With: Spouse  General  Date of Onset: 06/16/11 HPI: MBSS 06/18/11 at [facility]; Patient transferred to CIR 06/19/11  Type of Study: Bedside swallow evaluation Diet Prior to this Study: Dysphagia 3 (soft);Nectar-thick liquids Temperature Spikes Noted: No Respiratory Status: Room air History of Intubation: No Behavior/Cognition: Alert;Cooperative;Pleasant mood Oral Cavity - Dentition: Dentures, top;Dentures, bottom Vision: Functional for self-feeding Patient Positioning: Upright in bed Baseline Vocal Quality: Clear Volitional Cough: Strong Volitional Swallow: Able to elicit  Oral Motor/Sensory Function  Overall Oral Motor/Sensory Function: Impaired Labial ROM: Reduced right Labial Symmetry: Abnormal symmetry right Labial Strength: Within Functional Limits Labial Sensation: Within Functional Limits Lingual ROM: Within Functional Limits Lingual Symmetry: Within Functional Limits Lingual Strength: Within Functional Limits Lingual Sensation: Within Functional Limits Facial ROM: Within Functional Limits Facial Symmetry: Within Functional Limits Facial Strength: Within Functional Limits Facial Sensation: Within Functional Limits  Consistency Results  Ice Chips Ice chips: Not tested  Thin Liquid Thin Liquid: Impaired Presentation: Cup;Self  Fed Pharyngeal  Phase Impairments: Multiple swallows;Delayed Swallow;Cough - Immediate;Cough - Delayed;Wet Vocal Quality;Other (comments) (watery eyes) Other Comments: patient with occassional mild delay in swallow initation which resulted in immediate cough response and intermittent cough througout trials  Nectar Thick Liquid Nectar Thick Liquid: Impaired Presentation: Cup Pharyngeal Phase Impairments: Multiple swallows  Honey Thick Liquid Honey Thick Liquid: Not tested  Puree Puree: Within functional limits Presentation: Spoon Other Comments: Observed RN administer medicaiton whole in puree   Solid Solid: Impaired Presentation: Self Fed Other  Comments: prolonged mastication, required liquid wash to reduce oral residue with delayed cough response  Fae Pippin, M.A., CCC-SLP [phone]  Anaiza Behrens 06/20/2011,2:33 PM

## 2011-06-20 NOTE — Progress Notes (Addendum)
Inpatient Rehabilitation Center Individual Statement of Services  Patient Name:  Kimberly Lester  Date:  06/20/2011  Welcome to the Inpatient Rehabilitation Center.  Our goal is to provide you with an individualized program based on your diagnosis and situation, designed to meet your specific needs.  With this comprehensive rehabilitation program, you will be expected to participate in at least 3 hours of rehabilitation therapies Monday-Friday, with modified therapy programming on the weekends.  Your rehabilitation program will include the following services:  Physical Therapy (PT), Occupational Therapy (OT), Speech Therapy (ST), 24 hour per day rehabilitation nursing, Therapeutic Recreaction (TR), Case Management (RN and Child psychotherapist), Rehabilitation Medicine, Nutrition Services and Pharmacy Services  Weekly team conferences will be held on  Wednesday  to discuss your progress.  Your RN Case Designer, television/film set will talk with you frequently to get your input and to update you on team discussions.  Team conferences with you and your family in attendance may also be held.  Estimated Length of Stay: 2-21/2 weeks                       Goals:  Min Assist-Supervision  Depending on your progress and recovery, your program may change.  Your RN Case Estate agent will coordinate services and will keep you informed of any changes.  Your RN Sports coach and SW names and contact numbers are listed  below.  The following services may also be recommended but are not provided by the Inpatient Rehabilitation Center:   Driving Evaluations  Home Health Rehabiltiation Services  Outpatient Rehabilitatation [facility]  Vocational Rehabilitation   Arrangements will be made to provide these services after discharge if needed.  Arrangements include referral to agencies that provide these services.  Your insurance has been verified to be:  AARP Medicare Complete Your primary doctor is:  Dr. Alinda Deem    Pertinent information will be shared with your doctor and your insurance company.  Case Manager: Lutricia Horsfall, [facility] [phone]  Social Worker:  Dossie Der, [place] [phone]  Information discussed with and copy given to patient by: Brock Ra, 06/20/2011, 4:59 PM

## 2011-06-20 NOTE — Progress Notes (Signed)
Social Work Assessment and Plan Assessment and Plan  Patient Name: Kimberly Lester  HQION'G Date: 06/20/2011  Problem List:  Patient Active Problem List  Diagnoses  . CVA (cerebral infarction)    Past Medical History:  Past Medical History  Diagnosis Date  . Osteoarthritis   . Restless leg syndrome   . Hypertension   . GERD (gastroesophageal reflux disease)   . Hypercholesterolemia     Past Surgical History:  Past Surgical History  Procedure Date  . Total hip arthroplasty 07/12/10    at [facility]    Discharge Planning  Discharge Planning Support Systems: Spouse/significant other;Children;Family members;Church/faith community;Friends/neighbors Assistance Needed: May require assist at discahrge Home Care Services: No  Social/Family/Support Systems    Employment Status Employment Status Employment Status: Retired Fish farm manager Issues: No issues Guardian/Conservator: None  Abuse/Neglect Abuse/Neglect Assessment (Assessment to be complete while patient is alone) Physical Abuse: Denies Verbal Abuse: Denies Sexual Abuse: Denies Exploitation of patient/patient's resources: Denies Self-Neglect: Denies  Emotional Status Emotional Status Pt's affect, behavior adn adjustment status: Pt is motivated and ready to work.  She will do whatever is asked of her.  Husband here and sleeping in the recliner chair.  She has done well with other health issues she is hopeful she will again, but realizes it may take time.  She reports: " I am a patient person." Recent Psychosocial Issues: Other health issues, recovered from hip replacement last year. Pyschiatric History: No history- deferred Beck Depression Screen due to coping appropriately with CVA and deficits.  Relies upon her faith to get through.  No substance issues.  Patient/Family Perceptions, Expectations & Goals Pt/Family Perceptions, Expectations and Goals Pt/Family understanding of  illness & functional limitations: Pt is able to explain her CVA and deficits.  She is motivated to improve and recover from this. She is encouraged by her movement she has gotten back already.  Husnband is here to observe in therapies. 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, retiree, church member, etc Anticipated changes in roles/activities/participation: Plans to resume these roles Pt/family expectations/goals: Pt states: " I want to be able to do for myself, my husband had breathing issues of his own."  Husband: " Our daughter's will help if necessary."  [employer] Stores: None Premorbid Home Care/DME Agencies: None Transportation available at discharge: Family Resource referrals recommended: Support group (specify) (CVA Support Group)  Discharge Visual merchandiser Resources: Media planner (specify) Investment banker, operational) Financial Resources: Restaurant manager, fast food Screen Referred: No Living Expenses: Lives with family Money Management: Patient;Spouse Home Management: pt, daughter's can asssit with this at discharge Patient/Family Preliminary Plans: Return home with husband and daughter's to assist. Pt hopes to be mod/i level at discharge.  Someone will always be there with pt.  Clinical Impression:  Pleasant female sitting up in bed ready to begin her therapies.  Husband sleeping in the chair.  Pt is fairly high level, should do well here. Supportive family who can arrange to be with a discharge.      Lucy Chris 06/20/2011

## 2011-06-20 NOTE — Evaluation (Signed)
Speech Language Pathology Assessment and Plan  Patient Details  Name: [place] Dodgen MRN: [id number] Date of Birth: June 14, 1930  SLP Diagnosis: none Rehab Potential: Good ELOS:  per OT and PT  Today's Date: 06/20/2011 Time: 0830-0900 Time Calculation (min): 30 min  Problem List:  Patient Active Problem List  Diagnoses  . CVA (cerebral infarction)    Past Medical History:  Past Medical History  Diagnosis Date  . Osteoarthritis   . Restless leg syndrome   . Hypertension   . GERD (gastroesophageal reflux disease)   . Hypercholesterolemia    Past Surgical History:  Past Surgical History  Procedure Date  . Total hip arthroplasty 07/12/10    at [facility] the [facility]    Assessment & Plan Clinical Impression: Patient is a 78 y.o. year old female with recent admission to [facility] on 06/16/11 with right sided weakness and slurred speech.  MRI revealed left pontine infarct.  Patient transferred to CIR on 06/19/2011 and is currently modified independent with all cognitive linguistic tasks assessed and family reports that a side from swallowing difficulty she is at baseline function.    SLP - End of Session Patient left: in bed;with call bell in reach;with family/visitor present Nurse Communication: Aspiration precautions reviewed;Diet recommendation;Swallow strategies reviewed Assessment SLP Recommendation/Assessment: Patient does not need any further Speech Lanaguage Pathology Services for cognitive-linguistic abilities No Skilled Speech Therapy: Patient is modified independent with all cognitive/linguistic skills Rehab Potential: Good Recommendation Follow up Recommendations: None Equipment Recommended: None recommended by SLP  SLP Evaluation Precautions/Restrictions  Precautions Precautions: Fall Precaution Comments: Dys.3 textures and nectar-thick liquids Required Braces or Orthoses: No Restrictions Weight Bearing Restrictions: No Pain Pain  Assessment Pain Assessment: No/denies pain Prior Functioning Cognitive/Linguistic Baseline: Within functional limits Type of Home: House Lives With: Spouse Receives Help From: Family Cognition Overall Cognitive Status: Appears within functional limits for tasks assessed Comprehension Auditory Comprehension Overall Auditory Comprehension: Appears within functional limits for tasks assessed Expression Expression Primary Mode of Expression: Verbal Verbal Expression Overall Verbal Expression: Appears within functional limits for tasks assessed Written Expression Dominant Hand: Right Written Expression: Unable to assess (comment) (due to upper extremity weakness) Oral/Motor Oral Motor/Sensory Function Overall Oral Motor/Sensory Function: Impaired Labial ROM: Reduced right Labial Symmetry: Abnormal symmetry right Labial Strength: Within Functional Limits Labial Sensation: Within Functional Limits Lingual ROM: Within Functional Limits Lingual Symmetry: Within Functional Limits Lingual Strength: Within Functional Limits Lingual Sensation: Within Functional Limits Facial ROM: Within Functional Limits Facial Symmetry: Within Functional Limits Facial Strength: Within Functional Limits Facial Sensation: Within Functional Limits Motor Speech Overall Motor Speech: Appears within functional limits for tasks assessed Articulation: Within functional limitis (modified independent )   Recommendations for other services: None  Discharge Criteria: Patient will be discharged from SLP if patient refuses treatment 3 consecutive times without medical reason, if treatment goals not met, if there is a change in medical status, if patient makes no progress towards goals or if patient is discharged from hospital.  The above assessment, treatment plan, treatment alternatives and goals were discussed and mutually agreed upon: by patient and by family  Charlane Ferretti.,  CCC-SLP [phone]  Latayna Ritchie 06/20/2011, 4:39 PM  .

## 2011-06-20 NOTE — Evaluation (Signed)
Physical Therapy Assessment and Plan  Patient Details  Name: Kimberly Lester MRN: [id number] Date of Birth: [birth year]  PT Diagnosis: Abnormal posture, Abnormality of gait, Hemiparesis dominant and Muscle weakness Rehab Potential: Good ELOS: 14 days   Today's Date: 06/20/2011 Time: 1100-1200 Time Calculation (min): 60 min  Assessment & Plan Clinical Impression: Patient is a 78 year old right handed female with history of HTN, dyslipidemia, admitted to [facility] on 01/27 with slurred speech and right sided weakness with difficulty walking. MRI brain done revealing acute non hemorrhagic left pontine infarct.   Patient transferred to CIR on 06/19/2011 .  Patient's past medical history is significant for OA, htn, GERD, high cholesterol, THA R hip 2/12.    Patient currently requires mod with mobility secondary to muscle weakness and abnormal tone, unbalanced muscle activation and decreased coordination.  Prior to hospitalization, patient was independent with mobility and lived with Spouse in a House home.  Home access is 2Stairs to enter.  Patient will benefit from skilled PT intervention to maximize safe functional mobility, minimize fall risk and decrease caregiver burden for planned discharge home with intermittent assist.  Anticipate patient will benefit from follow up OP at discharge.  PT - End of Session Activity Tolerance: Tolerates 30+ min activity with multiple rests Endurance Deficit: Yes Endurance Deficit Description: requires frequent rest breaks PT Assessment Rehab Potential: Good PT Plan PT Frequency: 1-2 X/day, 60-90 minutes Estimated Length of Stay: 14 days PT Treatment/Interventions: Ambulation/gait training;Balance/vestibular training;DME/adaptive equipment instruction;Functional mobility training;Neuromuscular re-education;Pain management;Patient/family education;Therapeutic Exercise;Therapeutic Activities;Stair training;UE/LE Strength taining/ROM;UE/LE Coordination  activities;Wheelchair propulsion/positioning PT Recommendation Follow Up Recommendations: Outpatient PT  Precautions/Restrictions Precautions Precautions: Fall Required Braces or Orthoses: No Restrictions Weight Bearing Restrictions: No Pain Pain Assessment Pain Assessment: No/denies pain Home Living/Prior Functioning Home Living Lives With: Spouse Receives Help From: Family Type of Home: House Home Layout: Two level;Able to live on main level with bedroom/bathroom Home Access: Stairs to enter Entrance Stairs-Rails: None Entrance Stairs-Number of Steps: 2 Home Adaptive Equipment: Walker - rolling Prior Function Level of Independence: Independent with basic ADLs;Independent with gait;Independent with transfers Able to Take Stairs?: Yes Driving: Yes  Cognition Overall Cognitive Status: Appears within functional limits for tasks assessed Sensation Sensation Light Touch: Appears Intact Proprioception: Appears Intact Motor  Motor Motor: Hemiplegia;Abnormal postural alignment and control Motor - Skilled Clinical Observations: R hemiparesis  Mobility Transfers Transfers: Yes Sit to Stand: 3: Mod assist Sit to Stand Details (indicate cue type and reason): cues at hips for fwd wt shift, manual assist to lift, verbal cues for hand placement Stand Pivot Transfers: 3: Mod assist Stand Pivot Transfer Details (indicate cue type and reason): without AD, cues for sequencing steps, cues for hand placment, manual assist to control descent Locomotion  Ambulation Ambulation: Yes Ambulation/Gait Assistance: 2: Max assist Ambulation Distance (Feet): 20 Feet Assistive device: None Ambulation/Gait Assistance Details (indicate cue type and reason): assist to prevent R LE buckling, Manual facilitation for wt shift, verbal cues to clear right foot in swing, assist at hips for stability and control Stairs / Additional Locomotion Stairs: Yes Stairs Assistance: 3: Mod assist Stairs  Assistance Details (indicate cue type and reason): cues for safe technique Stair Management Technique: Two rails Number of Stairs: 3  Wheelchair Mobility Wheelchair Mobility: Yes Wheelchair Assistance: 4: Min Education officer, museum: Both lower extermities Wheelchair Parts Management: Needs assistance Distance: 10' with assist for getting started, cues for turning  Trunk/Postural Assessment  Cervical Assessment Cervical Assessment: Within Functional Limits Thoracic Assessment Thoracic Assessment: Within Functional  Limits Lumbar Assessment Lumbar Assessment:  (posterior tilt) Postural Control Postural Control:  (R side hemiparesis)  Balance Balance Balance Assessed: Yes Dynamic Sitting Balance Dynamic Sitting - Level of Assistance: 5: Stand by assistance (with functional tasks) Static Standing Balance Static Standing - Level of Assistance: 4: Min assist (with 1 UE support) Dynamic Standing Balance Dynamic Standing - Level of Assistance: 2: Max assist (without UE support for functional activities) Extremity Assessment      RLE Assessment RLE Assessment: Exceptions to [facility] RLE Strength RLE Overall Strength:  (overall 3-/5, ROM limited by decreased strength) LLE Assessment LLE Assessment: Within Functional Limits  Recommendations for other services: None  Discharge Criteria: Patient will be discharged from PT if patient refuses treatment 3 consecutive times without medical reason, if treatment goals not met, if there is a change in medical status, if patient makes no progress towards goals or if patient is discharged from hospital.  The above assessment, treatment plan, treatment alternatives and goals were discussed and mutually agreed upon: by patient  Treatment initiated during session Gait training with RW with mod A 50'x2 with facilitation at R hip for control during stance, verbal cues to decrease scissoring of R LE.  Pt able to correct scissoring with verbal cues.   Stair training with 1 handrail mod A 3 stairs with assist for eccentric control for R LE.  Car transfers with min A with RW.  Pt able to lift R LE into car with hands.  Standing NMR for R LE with focus on quad contraction, glut contraction with wt shifts, heel raises on L, mini squats.  Pt able to contract quad and glutes but has difficulty sustaining contraction > 2-3 seconds.    Ahlana Slaydon 06/20/2011, 3:38 PM

## 2011-06-20 NOTE — Progress Notes (Signed)
Recreational Therapy Assessment and Plan  Patient Details  Name: Kimberly Lester MRN: [id number] Date of Birth: 03/26/1931  Rehab Potential: Good ELOS: 14 days Assessment Clinical Impression: Ms Kimberly Lester is a 78 year old right handed female with history of HTN, dyslipidemia, admitted to [facility] on 01/27 with slurred speech and right sided weakness with difficulty walking. MRI brain done revealing acute non hemorrhagic left pontine infarct. Carotid dopplers with bilateral carotid bifurcation and proximal ICA plaque with less than 50% stenosis. Patient placed on plavix for cva prophylaxis. Pt transferred to CIR on 06/02/11. Past Medical History   Diagnosis  Date   .  Osteoarthritis    .  Restless leg syndrome    .  Hypertension    .  GERD (gastroesophageal reflux disease)    .  Hypercholesterolemia     Past Surgical History   Procedure  Date    C-Section    .  Total hip arthroplasty  07/12/10      Pt presents with decreased activity tolerance, decreased functional mobility, decreased balance, right sided weakness, dysarthria limiting pt's independence with leisure/community pursuits.   Recreational Therapy Leisure History/Participation Premorbid leisure interest/current participation: Games - Careers adviser;Nature - Lawn care;Games - Bingo;Games - Jig-saw puzzles;Ashby Dawes - Flower gardening;Nature - Vegetable gardening;Community - Grocery store;Community - Shopping mall Expression Interests: Music (Comment) (blue grass) Spiritual Interests: Church;Womens'Men's Groups ([employer] Interested in Spiritual Care Staff Visit: Yes Other Leisure Interests: Television;Movies Medical illustrator) Leisure Participation Style: With Family/Friends;Alone Awareness of Community Resources: Good-identify 3 post discharge leisure resources [employer] Appropriate for Education?: Yes Patient Agreeable to [employer]?: Yes Stress Management: Good Patient agreeable to Pet Therapy: Yes Does patient have  pets?: Yes (2 outside dogs) Social interaction - Mood/Behavior: Cooperative Recreational Therapy Orientation Orientation -Reviewed with patient: Available activity resources;Use of Dayroom Strengths/Weaknesses Patient Strengths/Abilities: Willingness to participate;Active premorbidly Patient weaknesses: Physical limitations  Plan Rec Therapy Plan Is patient appropriate for Therapeutic Recreation?: Yes Rehab Potential: Good Treatment times per week: Min 1 time per week >20 minutes Therapy Goals Achieved By:: Recreation/leisure participation;Community reintegration/education;1:1 session;Group participation (Comment);Adaptive equipment instruction;Patient/family education  Recommendations for other services: None  Discharge Criteria: Patient will be discharged from TR if patient refuses treatment 3 consecutive times without medical reason.  If treatment goals not met, if there is a change in medical status, if patient makes no progress towards goals or if patient is discharged from hospital.  The above assessment, treatment plan, treatment alternatives and goals were discussed and mutually agreed upon: by patient  Sreeja Spies 06/20/2011, 9:19 AM

## 2011-06-20 NOTE — Progress Notes (Signed)
Patient information reviewed and entered into [facility] system by Melina Mosteller, RN, CRRN, PPS Coordinator.  Information including medical coding and functional independence measure will be reviewed and updated through discharge.     Per nursing patient was given "Data Collection Information Summary for Patients in Inpatient Rehabilitation Facilities with attached "Privacy Act Statement-Health Care Records" upon admission.   

## 2011-06-21 DIAGNOSIS — Z5189 Encounter for other specified aftercare: Secondary | ICD-10-CM

## 2011-06-21 DIAGNOSIS — I6330 Cerebral infarction due to thrombosis of unspecified cerebral artery: Secondary | ICD-10-CM

## 2011-06-21 LAB — URINE CULTURE: Culture  Setup Time: 201301302332

## 2011-06-21 NOTE — Progress Notes (Signed)
Occupational Therapy Session Note  Patient Details  Name: Kimberly Lester MRN: [id number] Date of Birth: Mar 04, 1931  Today's Date: 06/21/2011 Time: 0903-1000 Time Calculation (min): 57 min  Precautions: Precautions Precautions: Fall Precaution Comments: Dys. 3 with nectar-thick liquids, right hemiplegia Required Braces or Orthoses: No Restrictions Weight Bearing Restrictions: No Other Position/Activity Restrictions: Patient had hip replacement 1 year ago and was on total hip precautions, but pt. states that she has not been compliant with precautions.      Skilled Therapeutic Interventions/Progress Updates: Pt seen for ADL retraining of bathing and dressing at shower level with a focus on RUE use and standing balance/ ambulation.  Worked on symmetrical  Sit to stand without UE support with mod assist to elevate from low surface.  Pt encouraged to use RUE as much as possible, she was able to lightly hold washcloth to wash the majority of her upperbody and 50% of lower body.  Discussed her total hip precautions as pt was not adhering to them at home.  Asked granddaughter to bring in her hip kit so we can reinforce this in ADL training.  Pt worked on RUE active movement facilitation with towel gripping, towel slides on bedside table for sh and elbow flex/ext and wrist extension.  Encouraged pt and granddaughter to work on each exercise 2-3 x a day within her activity tolerance.           Pain Pain Assessment Pain Assessment: No/denies pain            Therapy/Group: Individual Therapy  Keirstan Iannello 06/21/2011, 11:52 AM

## 2011-06-21 NOTE — Progress Notes (Signed)
Subjective/Complaints: Slept better last night with trazodone. No BM for yesterday. No complaints of SOB, CP,ABD pain. Review of Systems - Negative except insomia Objective: Vital Signs: Blood pressure 133/80, pulse 73, temperature 98.2 F (36.8 C), temperature source Oral, resp. rate 18, height 5\' 2"  (1.575 m), weight 68.8 kg (151 lb 10.8 oz), SpO2 96.00%. No results found.  Basename 06/20/11 0600  WBC 4.9  HGB 12.5  HCT 36.9  PLT 160    Basename 06/20/11 0600  NA 142  K 3.7  CL 106  CO2 25  GLUCOSE 106*  BUN 24*  CREATININE 0.61  CALCIUM 9.5   CBG (last 3)  No results found for this basename: GLUCAP:3 in the last 72 hours  Wt Readings from Last 3 Encounters:  06/19/11 68.8 kg (151 lb 10.8 oz)  06/19/11 68.493 kg (151 lb)    Physical Exam:  General appearance: alert and no distress Head: Normocephalic, without obvious abnormality, atraumatic Eyes: negative, conjunctivae/corneas clear. PERRL, EOM's intact. Nose: Nares normal. Septum midline. Mucosa normal. No drainage or sinus tenderness. Throat: lips, mucosa, and tongue normal; Full set dentures in place. Neck: supple, no masses Resp: clear to auscultation bilaterally Cardio: regular rate and rhythm GI: soft, non-tender; bowel sounds normal; no masses,  no organomegaly Extremities: extremities normal, atraumatic, no cyanosis or edema Pulses: 2+ and symmetric Skin: Skin color, texture, turgor normal. Mild heat rash getting better. Neurologic: Mental status: Alert, oriented, thought content appropriateRUE weakness with ataxia.  Motor RUE 2-/5 bi,tri,grip RLE 2-/5 hip flex, knee flex, ankle DF/PF  Assessment/Plan: 1. Functional deficits secondary to left pontine infarct  which require 3+ hours per day of interdisciplinary therapy in a comprehensive inpatient rehab setting. Physiatrist is providing close team supervision and 24 hour management of active medical problems listed below. Physiatrist and rehab team  continue to assess barriers to discharge/monitor patient progress toward functional and medical goals. FIM: FIM - Bathing Bathing Steps Patient Completed: Chest;Right Arm;Abdomen;Front perineal area;Right upper leg;Left upper leg Bathing: 3: Mod-Patient completes 5-7 81f 10 parts or 50-74%  FIM - Upper Body Dressing/Undressing Upper body dressing/undressing steps patient completed: Thread/unthread right bra strap;Thread/unthread right sleeve of pullover shirt/dresss;Put head through opening of pull over shirt/dress Upper body dressing/undressing: 2: Max-Patient completed 25-49% of tasks FIM - Lower Body Dressing/Undressing Lower body dressing/undressing: 1: Total-Patient completed less than 25% of tasks  FIM - Toileting Toileting steps completed by patient: Adjust clothing prior to toileting;Adjust clothing after toileting (gown) Toileting: 2: Max-Patient completed 1 of 3 steps  FIM - Diplomatic Services operational officer Devices: Bedside commode Toilet Transfers: 2-To toilet/BSC: Max A (lift and lower assist)  FIM - Bed/Chair Transfer Bed/Chair Transfer: 3: Bed > Chair or W/C: Mod A (lift or lower assist);3: Chair or W/C > Bed: Mod A (lift or lower assist)  FIM - Locomotion: Wheelchair Distance: 82' with assist for getting started, cues for turning Locomotion: Wheelchair: 1: Travels less than 50 ft with minimal assistance (Pt.>75%) FIM - Locomotion: Ambulation Ambulation/Gait Assistance: 2: Max assist Locomotion: Ambulation: 1: Travels less than 50 ft with maximal assistance (Pt: 25 - 49%)  Comprehension Comprehension Mode: Auditory Comprehension: 6-Follows complex conversation/direction: With extra time/assistive device  Expression Expression Mode: Verbal Expression: 6-Expresses complex ideas: With extra time/assistive device  Social Interaction Social Interaction: 6-Interacts appropriately with others with medication or extra time (anti-anxiety,  antidepressant).  Problem Solving Problem Solving: 6-Solves complex problems: With extra time  Memory Memory: 6-More than reasonable amt of time  Medical Problem List and  Plan:   1. DVT Prophylaxis/Anticoagulation: Pharmaceutical: Lovenox   2. Pain Management: N/A. Used aleve for arthritic pain. Will use tylenol prn. Some complaints of right knee pain/instability this am.  Will add Voltaren gel.  3. Mood:  Some anxiety noted about deficits and concerns about recovery.  Ego support provided.  Continue ot monitor with ego support by team.   4. HTN: Monitor with bid checks. Blood pressure still borderline.  Monitor for now.  5. Dyslipidemia: continue zocor.  LFTs normal.   6. RLS: Increased reqip helped last night.  7. GERD: Continue PPI.   8. Acute renal insufficiency:  Push fluids as on HCTZ.  Water protocol initiated and should help with hydration.    LOS (Days) 2 A FACE TO FACE EVALUATION WAS PERFORMED  Love, Evlyn Kanner 06/21/2011, 7:27 AM

## 2011-06-21 NOTE — Progress Notes (Signed)
Per State Regulation 482.30 This chart was reviewed for medical necessity with respect to the patient's Admission/Duration of stay. Pt participating in therapies. Dys-3 diet with nectar-thk liquids with intermittent S. On water protocol.  Monitoring BP.  Meryl Dare                 Nurse Care Manager            Next Review Date: 06/24/11

## 2011-06-21 NOTE — Progress Notes (Signed)
Speech Language Pathology Daily Session Note  Patient Details  Name: Kimberly Lester MRN: [id number] Date of Birth: 1930-09-21  Today's Date: 06/21/2011 Time: 1130-1230 Time Calculation (min): 60 min  Short Term Goals: Patient will consume recommended diet without observed clinical signs of aspiration with: Modified independent assistance  Patient will utilize recommended strategies during swallow to increase swallowing safety with: Modified independent assistance  Skilled Therapeutic Interventions: Patient participated in diagnostic treatment of dysphagia with lunch. Patient consumed water via cup with coughing and eye watering 4/4 trials; however when SLP cued for small sips and 2 swallows patient did not display any s/s of aspiration.  Patient then consumed lunch with Dys.3 textures and thin liquids via cup with small sips and 2 swallows with 2 coughing episodes throughout whole meal due to large sips or decreased use of 2 swallows.  Overall, SLP facilitated session with moderate assist cues faded to minimal assist cues.  Granddaughter, Sherrilyn Rist present for session and ok to supervise with meals.  Recommend diet advancement to regular textures and thin liquids with small cup sips and 2 swallows per sip, as well as full supervision.   Daily Session Precautions/Restrictions  Precautions Precaution Comments: regular thin with full supervision  FIM:  Comprehension Comprehension Mode: Auditory Comprehension: 7-Follows complex conversation/direction: With no assist Expression Expression Mode: Verbal Expression: 6-Expresses complex ideas: With extra time/assistive device Social Interaction Social Interaction: 6-Interacts appropriately with others with medication or extra time (anti-anxiety, antidepressant). Problem Solving Problem Solving: 7-Solves complex problems: Recognizes & self-corrects Memory Memory: 6-More than reasonable amt of time FIM - Eating Eating Activity: 4: Helper occasionally  brings food to mouth Pain Pain Assessment Pain Assessment: No/denies pain  Therapy/Group: Individual Therapy  Charlane Ferretti., CCC-SLP [phone]  Alister Staver 06/21/2011, 4:18 PM

## 2011-06-21 NOTE — Progress Notes (Signed)
Physical Therapy Session Note  Patient Details  Name: Kimberly Lester MRN: [id number] Date of Birth: 1931/04/07  Today's Date: 06/21/2011 Time:1306-1401 and  8841-6606 Time Calculation (min): 55 min and 26 min  Precautions: Precautions Precautions: Fall Precaution Comments: Patient had R THR one year ago; patient states she was still on precautions one year s/p/, will need to verify Required Braces or Orthoses: No Restrictions Weight Bearing Restrictions: No Other Position/Activity Restrictions: Patient had hip replacement 1 year ago and was on total hip precautions, but pt. states that she has not been compliant with precautions.  Short Term Goals: PT Short Term Goal 1: Pt will maintain dynamic standing balance with min A for functional task PT Short Term Goal 1 - Progress: Progressing toward goal PT Short Term Goal 2: Pt will gait with LRAD 100' with min A in controlled environment PT Short Term Goal 2 - Progress: Progressing toward goal PT Short Term Goal 3: Pt will negotiate 2 stairs without handrails for home entry with mod A PT Short Term Goal 3 - Progress: Progressing toward goal Pain Pain Assessment Pain Assessment: No/denies pain Locomotion  Ambulation Ambulation: Yes Ambulation/Gait Assistance: 3: Mod assist Ambulation Distance (Feet): 25 Feet (x 2) Assistive device: Rolling walker Ambulation/Gait Assistance Details: Tactile cues for sequencing;Tactile cues for weight shifting;Tactile cues for posture;Tactile cues for placement;Verbal cues for sequencing;Verbal cues for technique;Verbal cues for gait pattern Ambulation/Gait Assistance Details (indicate cue type and reason): Pre-gait activity with bilat UE support on RW with lateral weight shifts in static standing with cues for trunk elongation and hip and knee extension when WB on RLE; semi tandem stance with RLE forward with closed chain ankle DF concentric and eccentric activation for heel strike to flat foot adding in  anterior translation of COG over R stance LE with full trunk elongation and hip and knee extension.  Gait train for carry over of sequence with mod assist, manual cues and verbal cues for R heel strike and full translation of COG over BOS to allow for full step length LLE. Corporate treasurer: Yes Wheelchair Assistance: 4: Administrator, sports Details: Verbal cues for sequencing;Verbal cues for Diplomatic Services operational officer: Both lower extermities Wheelchair Parts Management: Needs assistance Distance: 150 with cues to fully extend and flex RLE for full propulsion; patient states that the RLE gets "lazy".   Other Treatments Treatments Therapeutic Activity: Patient's granddaughter states her bed at home is 29 inches tall but a 5 inch foam can be removed.  W/c <> bed and bed mobility assessment on 29" tall mat; patient required max A to scoot posterior onto bed and mod-max A for sit to supine to bring LE up onto bed.  Patient will likely have to remove foam to transfer safely.  Bed mobility training for more coordinated and efficient sequence with lower and upper trunk rotation for activation of core reaching across midline and performing rolling to L and R with focus on trunk flexion and shoulder protraction.  Side to sit training with focus on full extension of RLE and L pelvic depression (R trunk flexion) to lower LE and L trunk flexion and R pelvic depression to bring bilat LE into bed with mod A.  Therapy/Group: Individual Therapy  Edman Circle [facility] 06/21/2011, 4:48 PM

## 2011-06-21 NOTE — Evaluation (Signed)
Occupational Therapy Assessment and Plan  Patient Details  Name: Kimberly Lester MRN: [id number] Date of Birth: 11-21-1930  OT Diagnosis: hemiplegia affecting dominant side Rehab Potential:  Excellent ELOS:   2 weeks  Today's Date: 06/21/2011 Time: 0903-1000 Time Calculation (min): 57 min  Assessment & Plan Clinical Impression: Patient is a 78 y.o. year old female with recent admission to  [facility] on 1/27  with right sided weakness and slurred speech.  Patient diagnosed with Left Pontine acute non-hemorrhagic infarct, patient's BP has been labile.   Patient transferred to CIR on 06/19/2011 .  Patient's past medical history is significant for Osteoarthritis, Restless Leg Syndrome, HTN, GERD, Hypercholesterolemia, and THA 06/2010.    Patient currently requires max with basic self-care skills secondary to muscle weakness and unbalanced muscle activation and decreased coordination.  Prior to hospitalization, patient could complete ADL/IADL with Independently.  Patient will benefit from skilled intervention to increase independence with basic self-care skills and increase level of independence with iADL prior to discharge home with care partner.  Anticipate patient will require minimal physical assistance and follow up outpatient.     Precautions/Restrictions  Restrictions Other Position/Activity Restrictions: Patient had hip replacement 1 year ago and was on total hip precautions, but pt. states that she has not been compliant with precautions. General   Vital Signs   Pain Pain Assessment Pain Assessment: No/denies pain Home Living/Prior Functioning Home Living Lives With: Spouse Receives Help From: Family Type of Home: House Home Layout: Two level;Able to live on main level with bedroom/bathroom Home Access: Stairs to enter Entrance Stairs-Rails: None Entrance Stairs-Number of Steps: 2 Bathroom Shower/Tub: Walk-in shower;Door Bathroom Accessibility: Yes How Accessible:  Accessible via walker Home Adaptive Equipment: Built-in shower seat;Reacher;Hand-held shower hose IADL History Homemaking Responsibilities: Yes Leisure and Hobbies: Likes cooking, gardening Prior Function Level of Independence: Independent with basic ADLs;Independent with gait;Independent with transfers Able to Take Stairs?: Yes Driving: Yes ADL   Vision/Perception     Cognition Orientation Level: Oriented X4 Sensation Sensation Light Touch: Appears Intact Stereognosis: Appears Intact Hot/Cold: Appears Intact Proprioception: Appears Intact Additional Comments: RUE kinesthesia intact Motor    Mobility     Trunk/Postural Assessment  Lumbar Assessment Lumbar Assessment: Exceptions to [facility] (recent hip replacement.  Posterior pelvic tilt) Postural Control Postural Control: Deficits on evaluation (right sided weakness in trunk, limits active flexion / exten)  Balance   Extremity/Trunk Assessment   LUE Assessment LUE Assessment: Within Functional Limits  Recommendations for other services: None  Discharge Criteria: Patient will be discharged from OT if patient refuses treatment 3 consecutive times without medical reason, if treatment goals not met, if there is a change in medical status, if patient makes no progress towards goals or if patient is discharged from hospital.  The above assessment, treatment plan, treatment alternatives and goals were discussed and mutually agreed upon: by patient and by family  Kimberly Lester 06/21/2011, 12:10 PM

## 2011-06-22 NOTE — Progress Notes (Addendum)
Occupational Therapy Session Note  Patient Details  Name: Kimberly Lester MRN: [id number] Date of Birth: December 19, 1930  Today's Date: 06/22/2011 Time: 1100-1230 Time Calculation (min):45min  Precautions: Precautions Precautions: Fall Precaution Comments: Patient had R THR one year ago; patient states she was still on precautions one year s/p/, will need to verify Required Braces or Orthoses: No Restrictions Weight Bearing Restrictions: No Other Position/Activity Restrictions: Patient had hip replacement 1 year ago and was on total hip precautions, but pt. states that she has not been compliant with precautions.  Short Term Goals: OT Short Term Goal 1: Patient will transfer to bedside commode with min assist (stand or squat pivot) OT Short Term Goal 2: Patient will dress upper body with mod asist OT Short Term Goal 3: Patient will dress lower body with mod assist OT Short Term Goal 4: Patient will utilize right upper extremity to wash hair, left arm with minimal assist OT Short Term Goal 5: Patient will identify 2-3 grooming feeding tasks which she can effectively complete bimanual  Skilled Therapeutic Interventions/Progress Updates:    Pt. Engaged in shower transfer, functional mobility, dynamic balance, RUE neuromuscular reeducation.  Pt. Ws min assist with transfers and dynamic balance while in shower.  Utilized reacher for LB bathing and dressing with minimal assist.  Pt. Fed self using right hand with minimal assist.  Needed assist with opening containers.      Pain Pain Assessment Pain Assessment: No/denies pain ADL  see FIM  Therapy/Group: Individual Therapy  Humberto Seals 06/22/2011, 12:18 PM

## 2011-06-22 NOTE — Progress Notes (Signed)
Physical Therapy Session Note  Patient Details  Name: Kimberly Lester MRN: [id number] Date of Birth: Apr 18, 1931  Today's Date: 06/22/2011 Time: 850-950 Time Calculation (min): 60 min  Precautions: Precautions Precautions: Fall Precaution Comments: Patient had R THR one year ago; patient states she was still on precautions one year s/p/, will need to verify Required Braces or Orthoses: No Restrictions Weight Bearing Restrictions: No Other Position/Activity Restrictions: Patient had hip replacement 1 year ago and was on total hip precautions, but pt. states that she has not been compliant with precautions.  Short Term Goals: PT Short Term Goal 1: Pt will maintain dynamic standing balance with min A for functional task PT Short Term Goal 1 - Progress: Progressing toward goal PT Short Term Goal 2: Pt will gait with LRAD 100' with min A in controlled environment PT Short Term Goal 2 - Progress: Progressing toward goal PT Short Term Goal 3: Pt will negotiate 2 stairs without handrails for home entry with mod A PT Short Term Goal 3 - Progress: Progressing toward goal  Pain Pain Assessment Pain Assessment: No/denies pain Locomotion  Ambulation Ambulation/Gait Assistance: 4: Min assist   Other Treatments Treatments Therapeutic Activity: Patient performed toileting and toilet transfers and standing at sink to wash hands with RW with steadying assist with decreased episodes of RLE collapsing but with verbal cues for safety and sequencing.  Sit to stand training from w/c with 1inch wedge under L foot to facilitate patient to laterally weight shift to R side for increased activation of RLE extensors with bilat UE reaching for wall and maintaining contact with wall during sit <> stand to maintain anterior lean in midline and translation of COG over BOS: assistance needed for concentric sit to stand and eccentric control for stand to sit x 4 reps.  Neuromuscular Facilitation: Right;Lower  Extremity;Activity to increase timing and sequencing;Activity to increase sustained activation;Activity to increase lateral weight shifting;Activity to increase anterior-posterior weight shifting on Kinetron in static standing while maintaining COG in midline with full hip and knee extension on RLE with use of pedal position as input with bilat UE support; added in reaching various directions for ball to decrease UE support and facilitate maintaining balance and postural control during weight shifts;  added dynamic lateral weight shifts with alternating LE extension on Kinetron 50 cm/sec; activity to facilitate increased use of RUE and weight shift to RLE with 1 inch wedge under L foot and RUE WB on mirror reaching in various directions to clean mirror with washcloth with AAROM.    Therapy/Group: Individual Therapy  Edman Circle [facility] 06/22/2011, 12:23 PM

## 2011-06-22 NOTE — Progress Notes (Signed)
Speech Language Pathology Daily Session Note  Patient Details  Name: Kimberly Lester MRN: [id number] Date of Birth: 04-15-1931  Today's Date: 06/22/2011 Time: 0800-0845 Time Calculation (min): 45 min  Short Term Goals: Patient will consume recommended diet without observed clinical signs of aspiration with: Modified independent assistance  Patient will utilize recommended strategies during swallow to increase swallowing safety with: Modified independent assistance    Skilled Therapeutic Interventions:Treatment addressed dysphagia goals during a functional meal.  Minimal physical assist with opening containers and preparing food.  Patient required minimal semantic cues to verbalize her swallow strategies and precautions.  Initially, patient required moderate semantic cues to use 2 swallows per bite/sip, however as meal progressed, this level of assist lessened to minimal verbal prompts.  Overt, immediate cough response with approximately 20% of thin liquid sips, however responsive cough was strong and may be clearing the trachea and/or laryngeal vestibule due to the immediate, strong response.  Plan:  Continue with POC and goals  Daily Session Precautions/Restrictions    FIM:  Comprehension Comprehension Mode: Auditory Comprehension: 7-Follows complex conversation/direction: With no assist Expression Expression Mode: Verbal Expression: 6-Expresses complex ideas: With extra time/assistive device Social Interaction Social Interaction: 6-Interacts appropriately with others with medication or extra time (anti-anxiety, antidepressant). Problem Solving Problem Solving: 6-Solves complex problems: With extra time Memory Memory: 6-Assistive device: No helper FIM - Eating Eating Activity: 5: Set-up assist for cut food    Pain Pain Assessment Pain Assessment: No/denies pain  Therapy/Group: Individual Therapy  Myra Rude, M.S.,CCC-SLP Pager [phone]  06/22/2011, 8:48 AM

## 2011-06-22 NOTE — Progress Notes (Signed)
Patient ID: Kimberly Lester, female   DOB: 02-14-1931, 78 y.o.   MRN: [id number] Subjective/Complaints: Sleeps better with trazodone. She had a BM for yesterday. No complaints of SOB, CP,ABD pain. Review of Systems - Negative except insomia Objective: Vital Signs: Blood pressure 138/75, pulse 83, temperature 98.9 F (37.2 C), temperature source Oral, resp. rate 20, height 5\' 2"  (1.575 m), weight 151 lb 10.8 oz (68.8 kg), SpO2 95.00%. No results found.  Basename 06/20/11 0600  WBC 4.9  HGB 12.5  HCT 36.9  PLT 160    Basename 06/20/11 0600  NA 142  K 3.7  CL 106  CO2 25  GLUCOSE 106*  BUN 24*  CREATININE 0.61  CALCIUM 9.5   CBG (last 3)  No results found for this basename: GLUCAP:3 in the last 72 hours  Wt Readings from Last 3 Encounters:  06/19/11 151 lb 10.8 oz (68.8 kg)  06/19/11 151 lb (68.493 kg)    Physical Exam:  General appearance: alert and no distress Head: Normocephalic, without obvious abnormality, atraumatic Eyes: negative, conjunctivae/corneas clear. PERRL, EOM's intact. Nose: Nares normal. Septum midline. Mucosa normal. No drainage or sinus tenderness. Throat: lips, mucosa, and tongue normal; Full set dentures in place. Neck: supple, no masses Resp: clear to auscultation bilaterally Cardio: regular rate and rhythm GI: soft, non-tender; bowel sounds normal; no masses,  no organomegaly Extremities: extremities normal, atraumatic, no cyanosis or edema Pulses: 2+ and symmetric Skin: Skin color, texture, turgor normal. Mild heat rash getting better. Neurologic: Mental status: Alert, oriented, thought content appropriateRUE weakness with ataxia.  Motor RUE 2-/5 bi,tri,grip RLE 2-/5 hip flex, knee flex, ankle DF/PF  Assessment/Plan: 1. Functional deficits secondary to left pontine infarct  which require 3+ hours per day of interdisciplinary therapy in a comprehensive inpatient rehab setting. Physiatrist is providing close team supervision and 24 hour  management of active medical problems listed below. Physiatrist and rehab team continue to assess barriers to discharge/monitor patient progress toward functional and medical goals. FIM: FIM - Bathing Bathing Steps Patient Completed: Chest;Right Arm;Left Arm;Abdomen;Front perineal area;Buttocks;Right upper leg;Left upper leg Bathing: 4: Min-Patient completes 8-9 10f 10 parts or 75+ percent  FIM - Upper Body Dressing/Undressing Upper body dressing/undressing steps patient completed: Thread/unthread left bra strap;Thread/unthread right bra strap;Thread/unthread left sleeve of pullover shirt/dress;Put head through opening of pull over shirt/dress;Pull shirt over trunk;Thread/unthread right sleeve of pullover shirt/dresss Upper body dressing/undressing: 4: Min-Patient completed 75 plus % of tasks FIM - Lower Body Dressing/Undressing Lower body dressing/undressing steps patient completed: Thread/unthread left underwear leg;Thread/unthread left pants leg;Don/Doff left shoe Lower body dressing/undressing: 2: Max-Patient completed 25-49% of tasks  FIM - Toileting Toileting steps completed by patient: Adjust clothing prior to toileting Toileting Assistive Devices: Grab bar or rail for support Toileting: 2: Max-Patient completed 1 of 3 steps  FIM - Diplomatic Services operational officer Devices: Therapist, music Transfers: 3-To toilet/BSC: Mod A (lift or lower assist)  FIM - Games developer Transfer: 3: Chair or W/C > Bed: Mod A (lift or lower assist)  FIM - Locomotion: Wheelchair Distance: 150 with cues to fully extend and flex RLE for full propulsion; patient states that the RLE gets "lazy". Locomotion: Wheelchair: 4: Travels 150 ft or more: maneuvers on rugs and over door sillls with minimal assistance (Pt.>75%) FIM - Locomotion: Ambulation Locomotion: Ambulation Assistive Devices: Designer, industrial/product Ambulation/Gait Assistance: 3: Mod assist Locomotion: Ambulation: 1: Travels  less than 50 ft with moderate assistance (Pt: 50 - 74%)  Comprehension Comprehension Mode: Auditory Comprehension:  7-Follows complex conversation/direction: With no assist  Expression Expression Mode: Verbal Expression: 6-Expresses complex ideas: With extra time/assistive device  Social Interaction Social Interaction: 6-Interacts appropriately with others with medication or extra time (anti-anxiety, antidepressant).  Problem Solving Problem Solving: 6-Solves complex problems: With extra time  Memory Memory: 7-Complete Independence: No helper  Medical Problem List and Plan:   1. DVT Prophylaxis/Anticoagulation: Pharmaceutical: Lovenox   2. Pain Management: N/A. Used aleve for arthritic pain. Will use tylenol prn. Some complaints of right knee pain/instability this am.  Will add Voltaren gel.  3. Mood:  Some anxiety noted about deficits and concerns about recovery.  Ego support provided.  Continue ot monitor with ego support by team.   4. HTN: Monitor with bid checks. Blood pressure still borderline.  Monitor for now.  5. Dyslipidemia: continue zocor.  LFTs normal.   6. RLS: Increased reqip helped last night.  7. GERD: Continue PPI.   8. Acute renal insufficiency:  Push fluids as on HCTZ.  Water protocol initiated and should help with hydration.    LOS (Days) 3 A FACE TO FACE EVALUATION WAS PERFORMED  Kimberly Lester 06/22/2011, 8:01 AM

## 2011-06-23 NOTE — Progress Notes (Signed)
Patient ID: Kimberly Lester, female   DOB: 1930-09-05, 78 y.o.   MRN: [id number] Patient ID: Kimberly Lester, female   DOB: 1930-08-23, 78 y.o.   MRN: [id number] Subjective/Complaints: Sleeps better with trazodone. She had a BM yesterday. No complaints of SOB, CP,ABD pain. Review of Systems - Negative except insomia Objective: Vital Signs: Blood pressure 150/77, pulse 75, temperature 99.4 F (37.4 C), temperature source Oral, resp. rate 18, height 5\' 2"  (1.575 m), weight 151 lb 10.8 oz (68.8 kg), SpO2 94.00%. No results found. No results found for this basename: WBC:2,HGB:2,HCT:2,PLT:2 in the last 72 hours No results found for this basename: NA:2,K:2,CL:2,CO2:2,GLUCOSE:2,BUN:2,CREATININE:2,CALCIUM:2 in the last 72 hours CBG (last 3)  No results found for this basename: GLUCAP:3 in the last 72 hours  Wt Readings from Last 3 Encounters:  06/19/11 151 lb 10.8 oz (68.8 kg)  06/19/11 151 lb (68.493 kg)    Physical Exam:  General appearance: alert and no distress Head: Normocephalic, without obvious abnormality, atraumatic Eyes: negative, conjunctivae/corneas clear. PERRL, EOM's intact. Nose: Nares normal. Septum midline. Mucosa normal. No drainage or sinus tenderness. Throat: lips, mucosa, and tongue normal; Full set dentures in place. Neck: supple, no masses Resp: clear to auscultation bilaterally Cardio: regular rate and rhythm GI: soft, non-tender; bowel sounds normal; no masses,  no organomegaly Extremities: extremities normal, atraumatic, no cyanosis or edema Pulses: 2+ and symmetric Skin: Skin color, texture, turgor normal. Mild heat rash getting better. Neurologic: Mental status: Alert, oriented, thought content appropriateRUE weakness with ataxia.  Motor RUE 2-/5 bi,tri,grip RLE 2-/5 hip flex, knee flex, ankle DF/PF  Assessment/Plan: 1. Functional deficits secondary to left pontine infarct  which require 3+ hours per day of interdisciplinary therapy in a comprehensive inpatient rehab  setting. Physiatrist is providing close team supervision and 24 hour management of active medical problems listed below. Physiatrist and rehab team continue to assess barriers to discharge/monitor patient progress toward functional and medical goals. FIM: FIM - Bathing Bathing Steps Patient Completed: Chest;Right Arm;Left Arm;Abdomen;Front perineal area;Buttocks;Right upper leg;Left upper leg;Right lower leg (including foot);Left lower leg (including foot) Bathing: 4: Min-Patient completes 8-9 44f 10 parts or 75+ percent  FIM - Upper Body Dressing/Undressing Upper body dressing/undressing steps patient completed: Thread/unthread right bra strap;Thread/unthread left bra strap;Thread/unthread right sleeve of pullover shirt/dresss;Thread/unthread left sleeve of pullover shirt/dress;Put head through opening of pull over shirt/dress;Pull shirt over trunk Upper body dressing/undressing: 4: Min-Patient completed 75 plus % of tasks FIM - Lower Body Dressing/Undressing Lower body dressing/undressing steps patient completed: Pull underwear up/down;Pull pants up/down Lower body dressing/undressing: 2: Max-Patient completed 25-49% of tasks  FIM - Toileting Toileting steps completed by patient: Adjust clothing prior to toileting;Performs perineal hygiene;Adjust clothing after toileting Toileting Assistive Devices: Grab bar or rail for support Toileting: 4: Steadying assist  FIM - Diplomatic Services operational officer Devices: Best boy Transfers: 3-To toilet/BSC: Mod A (lift or lower assist)  FIM - Games developer Transfer: 2: Bed > Chair or W/C: Max A (lift and lower assist);2: Chair or W/C > Bed: Max A (lift and lower assist)  FIM - Locomotion: Wheelchair Distance: 150 with cues to fully extend and flex RLE for full propulsion; patient states that the RLE gets "lazy". Locomotion: Wheelchair: 1: Total Assistance/staff pushes wheelchair (Pt<25%) FIM - Locomotion:  Ambulation Locomotion: Ambulation Assistive Devices: Designer, industrial/product Ambulation/Gait Assistance: 4: Min assist Locomotion: Ambulation: 1: Travels less than 50 ft with moderate assistance (Pt: 50 - 74%)  Comprehension Comprehension Mode: Auditory Comprehension: 7-Follows complex conversation/direction: With no  assist  Expression Expression Mode: Verbal Expression Assistive Devices: 6-Other (Comment) Expression: 5-Expresses complex 90% of the time/cues < 10% of the time  Social Interaction Social Interaction Mode: Asleep Social Interaction: 6-Interacts appropriately with others with medication or extra time (anti-anxiety, antidepressant).  Problem Solving Problem Solving Mode: Not assessed Problem Solving: 6-Solves complex problems: With extra time  Memory Memory Mode: Asleep Memory Assistive Devices: Other (Comment) Memory: 6-More than reasonable amt of time  Medical Problem List and Plan:   1. DVT Prophylaxis/Anticoagulation: Pharmaceutical: Lovenox   2. Pain Management: N/A. Used aleve for arthritic pain. Will use tylenol prn. Some complaints of right knee pain/instability this am.  Will add Voltaren gel.  3. Mood:  Some anxiety noted about deficits and concerns about recovery.  Ego support provided.  Continue ot monitor with ego support by team.   4. HTN: Monitor with bid checks. Blood pressure still borderline.  Monitor for now: no change in meds.  5. Dyslipidemia: continue zocor.  LFTs normal.   6. RLS: Increased reqip helped last night.  7. GERD: Continue PPI.   8. Acute renal insufficiency:  Push fluids as on HCTZ.  Water protocol initiated and should help with hydration.    LOS (Days) 4 A FACE TO FACE EVALUATION WAS PERFORMED  Alex Blossie Raffel 06/23/2011, 8:13 AM

## 2011-06-23 NOTE — Progress Notes (Signed)
Physical Therapy Note  Patient Details  Name: Ladasia Sircy MRN: [id number] Date of Birth: 11-Oct-1930 Today's Date: 06/23/2011  4098-1191 (40 minutes) individual Pain- no complaint of pain Focus of treatment: Gait training with/wothout AD focusing on RT hip/knee extension in stance and facilitating heel/toe gait pattern Treatment: Gait 50 feet X 3 with RW with vcs for Lt dorsiflexion at heel strike with tactile cues at hip to facilitate hip rotation during swing (min/mod assist) ; gait without assistive device  Approximately 5 feet mod/max assist with decreased weight bearing/decreased terminal knee extension in stance on left and assist for balance. Stepping forward and backward uninvolved LE with tactile cues to maintain terminal knee extension on left in weightbearing; up/down 6 inch step on Left LE for concentric/eccentric knee and hip control in stance.   Elliana Bal,JIM 06/23/2011, 3:42 PM

## 2011-06-24 NOTE — Progress Notes (Signed)
Physical Therapy Session Note  Patient Details  Name: Kimberly Lester MRN: [id number] Date of Birth: May 21, 1930  Today's Date: 06/24/2011 Time: 1109-1202 and 4098-1191 Time Calculation (min): 53 min and 26 min  Precautions: Precautions Precautions: Fall Precaution Comments: Patient had R THR one year ago; patient states she was still on precautions one year s/p/, will need to verify Required Braces or Orthoses: No Restrictions Weight Bearing Restrictions: No Other Position/Activity Restrictions: Patient had hip replacement 1 year ago and was on total hip precautions, but pt. states that she has not been compliant with precautions.  Short Term Goals: PT Short Term Goal 1: Pt will maintain dynamic standing balance with min A for functional task PT Short Term Goal 1 - Progress: Progressing toward goal PT Short Term Goal 2: Pt will gait with LRAD 100' with min A in controlled environment PT Short Term Goal 2 - Progress: Progressing toward goal PT Short Term Goal 3: Pt will negotiate 2 stairs without handrails for home entry with mod A PT Short Term Goal 3 - Progress: Progressing toward goal  Pain Pain Assessment Pain Assessment: No/denies pain  Locomotion  Ambulation Ambulation: Yes Ambulation/Gait Assistance: 3: Mod assist Ambulation Distance (Feet): 30 Feet Assistive device: Rolling walker;Other (Comment) (wall rail) Ambulation/Gait Assistance Details: Tactile cues for sequencing;Tactile cues for weight shifting;Visual cues/gestures for precautions/safety;Visual cues/gestures for sequencing;Verbal cues for sequencing;Verbal cues for technique;Verbal cues for precautions/safety;Verbal cues for gait pattern;Verbal cues for safe use of DME/AE Ambulation/Gait Assistance Details (indicate cue type and reason): Patient reports that throughout the house there are low thresholds and steps during surface changes and between rooms; gait train with RW and then with wall rail to mimic SPC to practice  safety and sequence with AD placing it over the threshold and sequence of stepping over threshold with focus on RLE full foot and toe clearance and maintaining forward translation of COG over stance LE.    PM session: gait training and assessment with cane in LUE x 50' x 2 with min-mod A and verbal cues for RLE heel strike, full translation of COG over R stance LE and activation of trunk and hip and knee extensors during stance to allow for full step length LLE; sequence training with RLE step ups onto 2 inch step with assistance for lateral and anterior weight shifts, full foot clearance, and full hip and knee extension to advance LLE without R genu recurvatum.    Other Treatments Treatments Therapeutic Activity: Bed mobility and safety training on flat ADL bed without rails with focus on activation of trunk and pelvis lateral flexion, rotation and elevation and depression during L and R rolling, for side <> sit on R side and minimizing use of head to lead movement, R hip extension activation during bridging to reposition in bed all with min-mod verbal and tactile cues.    Poor carryover of sequence in pm; patient still attempting to throw LE into bed by leaning backwards across bed quickly.    Therapy/Group: Individual Therapy  Edman Circle Faucette 06/24/2011, 1:00 PM

## 2011-06-24 NOTE — Progress Notes (Signed)
Patient ID: Kimberly Lester, female   DOB: [birth year], 78 y.o.   MRN: [id number] Subjective/Complaints:  Bowel and Bladder working ok no c/os No complaints of SOB, CP,ABD pain. Review of Systems - Negative except insomia Objective: Vital Signs: Blood pressure 121/68, pulse 66, temperature 98.1 F (36.7 C), temperature source Oral, resp. rate 18, height 5\' 2"  (1.575 m), weight 68.8 kg (151 lb 10.8 oz), SpO2 92.00%. No results found. No results found for this basename: WBC:2,HGB:2,HCT:2,PLT:2 in the last 72 hours No results found for this basename: NA:2,K:2,CL:2,CO2:2,GLUCOSE:2,BUN:2,CREATININE:2,CALCIUM:2 in the last 72 hours CBG (last 3)  No results found for this basename: GLUCAP:3 in the last 72 hours  Wt Readings from Last 3 Encounters:  06/19/11 68.8 kg (151 lb 10.8 oz)  06/19/11 68.493 kg (151 lb)    Physical Exam:  General appearance: alert and no distress Head: Normocephalic, without obvious abnormality, atraumatic Eyes: negative, conjunctivae/corneas clear. PERRL, EOM's intact. Nose: Nares normal. Septum midline. Mucosa normal. No drainage or sinus tenderness. Throat: lips, mucosa, and tongue normal; Full set dentures in place. Neck: supple, no masses Resp: clear to auscultation bilaterally Cardio: regular rate and rhythm GI: soft, non-tender; bowel sounds normal; no masses,  no organomegaly Extremities: extremities normal, atraumatic, no cyanosis or edema Pulses: 2+ and symmetric Skin: Skin color, texture, turgor normal. Mild heat rash getting better. Neurologic: Mental status: Alert, oriented, thought content appropriateRUE weakness with ataxia.  Motor RUE 2-/5 bi,tri,grip RLE 2-/5 hip flex, knee flex, ankle DF/PF  Assessment/Plan: 1. Functional deficits secondary to left pontine infarct  which require 3+ hours per day of interdisciplinary therapy in a comprehensive inpatient rehab setting. Physiatrist is providing close team supervision and 24 hour management of active  medical problems listed below. Physiatrist and rehab team continue to assess barriers to discharge/monitor patient progress toward functional and medical goals. FIM: FIM - Bathing Bathing Steps Patient Completed: Chest;Right Arm;Abdomen;Left Arm;Front perineal area;Buttocks;Right upper leg;Left upper leg;Right lower leg (including foot);Left lower leg (including foot) Bathing: 4: Min-Patient completes 8-9 44f 10 parts or 75+ percent  FIM - Upper Body Dressing/Undressing Upper body dressing/undressing steps patient completed: Thread/unthread right bra strap;Thread/unthread left bra strap;Thread/unthread right sleeve of pullover shirt/dresss;Thread/unthread left sleeve of pullover shirt/dress;Put head through opening of pull over shirt/dress;Pull shirt over trunk Upper body dressing/undressing: 4: Min-Patient completed 75 plus % of tasks FIM - Lower Body Dressing/Undressing Lower body dressing/undressing steps patient completed: Pull underwear up/down Lower body dressing/undressing: 2: Max-Patient completed 25-49% of tasks  FIM - Toileting Toileting steps completed by patient: Adjust clothing prior to toileting;Performs perineal hygiene;Adjust clothing after toileting Toileting Assistive Devices: Grab bar or rail for support Toileting: 4: Steadying assist  FIM - Diplomatic Services operational officer Devices: Grab bars;Walker Toilet Transfers: 4-From toilet/BSC: Min A (steadying Pt. > 75%)  FIM - Bed/Chair Transfer Bed/Chair Transfer Assistive Devices: Therapist, occupational: 4: Bed > Chair or W/C: Min A (steadying Pt. > 75%);4: Chair or W/C > Bed: Min A (steadying Pt. > 75%)  FIM - Locomotion: Wheelchair Distance: 150 with cues to fully extend and flex RLE for full propulsion; patient states that the RLE gets "lazy". Locomotion: Wheelchair: 0: Activity did not occur FIM - Locomotion: Ambulation Locomotion: Ambulation Assistive Devices: Designer, industrial/product Ambulation/Gait  Assistance: 3: Mod assist Locomotion: Ambulation: 1: Travels less than 50 ft with moderate assistance (Pt: 50 - 74%)  Comprehension Comprehension Mode: Auditory Comprehension: 6-Follows complex conversation/direction: With extra time/assistive device  Expression Expression Mode: Verbal Expression Assistive Devices: 6-Other (Comment) Expression: 6-Expresses  complex ideas: With extra time/assistive device  Social Interaction Social Interaction Mode: Asleep Social Interaction: 6-Interacts appropriately with others with medication or extra time (anti-anxiety, antidepressant).  Problem Solving Problem Solving Mode: Not assessed Problem Solving: 6-Solves complex problems: With extra time  Memory Memory Mode: Asleep Memory Assistive Devices: Other (Comment) Memory: 6-More than reasonable amt of time  Medical Problem List and Plan:   1. DVT Prophylaxis/Anticoagulation: Pharmaceutical: Lovenox   2. Pain Management: N/A. Used aleve for arthritic pain. Will use tylenol prn. Some complaints of right knee pain/instability this am.  Will add Voltaren gel.  3. Mood:  Some anxiety noted about deficits and concerns about recovery.  Ego support provided.  Continue ot monitor with ego support by team.   4. HTN: Monitor with bid checks. Blood pressure still borderline.  Monitor for now.  5. Dyslipidemia: continue zocor.  LFTs normal.   6. RLS: Increased reqip helped 7. GERD: Continue PPI.   8. Acute renal insufficiency:  Push fluids as on HCTZ.  Water protocol initiated and should help with hydration.    LOS (Days) 5 A FACE TO FACE EVALUATION WAS PERFORMED  Topher Buenaventura E 06/24/2011, 9:29 AM

## 2011-06-24 NOTE — Progress Notes (Signed)
Per State Regulation 482.30 This chart was reviewed for medical necessity with respect to the patient's Admission/Duration of stay. Pt participating in therapies and progressing slowly. Fatigues quickly but is motivated. Diet upgraded to regular. Meryl Dare                 Nurse Care Manager             Next Review Date: 06/27/11

## 2011-06-24 NOTE — Progress Notes (Signed)
Occupational Therapy Session Note  Patient Details  Name: Kimberly Lester MRN: [id number] Date of Birth: March 12, 1931  Today's Date: 06/24/2011 Time: 0900-1005 Time Calculation (min): 65 min  Precautions: Precautions Precautions: Fall Precaution Comments: Patient had R THR one year ago; patient states she was still on precautions one year s/p/, will need to verify Required Braces or Orthoses: No Restrictions Weight Bearing Restrictions: No Other Position/Activity Restrictions: Patient had hip replacement 1 year ago and was on total hip precautions, but pt. states that she has not been compliant with precautions.  Short Term Goals: OT Short Term Goal 1: Patient will transfer to bedside commode with min assist (stand or squat pivot) OT Short Term Goal 2: Patient will dress upper body with mod asist OT Short Term Goal 3: Patient will dress lower body with mod assist OT Short Term Goal 4: Patient will utilize right upper extremity to wash hair, left arm with minimal assist OT Short Term Goal 5: Patient will identify 2-3 grooming feeding tasks which she can effectively complete bimanual  Skilled Therapeutic Interventions/Progress Updates: Pt seen for ADL retraining of bathing at shower level, toileting, dressing with a focus on RUE functional use, balance, and contoled sit to stand.  Pt performed most activities with only steady assist as she is demonstrating improved motor control through right side.  Pt has active shoulder flexion to 70 degrees.  Pt used reacher to start pants over left foot.  Attempted to use brush in right hand and was able to get brush head but unable to manipulate in in hand well.  Pt also seen for RUE neuro re-ed with a focus on  Motor contol with a/arom and bimanual holding activities.  Pt able to hold tennis ball in hand and bring to mouth as if it was an apple.   Her arm fatigues quickly but is highly motivated. Pt assigned "homework" of isolated finger extension exercises.           Pain Pain Assessment Pain Assessment: No/denies pain        Therapy/Group: Individual Therapy  Malya Cirillo 06/24/2011, 10:26 AM

## 2011-06-24 NOTE — Plan of Care (Signed)
Problem: RH BOWEL ELIMINATION Goal: RH STG MANAGE BOWEL WITH ASSISTANCE STG Manage Bowel with Assistance. Min assist of caregiver  Outcome: Progressing Patient continent

## 2011-06-24 NOTE — Progress Notes (Signed)
Speech Language Pathology Daily Session Note  Patient Details  Name: Kimberly Lester MRN: [id number] Date of Birth: January 17, 1931  Today's Date: 06/24/2011 Time:  -    Short Term Goals: Patient will consume recommended diet without observed clinical signs of aspiration with: Modified independent assistance  Patient will utilize recommended strategies during swallow to increase swallowing safety with: Modified independent assistance    Skilled Therapeutic Interventions:  Pt. seen  for dysphagia therapy during breakfast meal.  Pt. Initiated session by stating swallow precautions needing verbal cue for "no straws" with liquids.  SLP provided moderate assist to open packages. Initially she did not want to utilize her utensil gripper but as meal progressed agreed gripper may help to grip the fork.  During meal pt. required overall moderate verbal cues for double swallow and rationale.  One cough immediately after sip of coffee possibly due to solid reside in oral cavity and decreased oral control.  Pt. Able to carry over information as evidenced by observing pt. drinking until she has cleared oral cavity.      Daily Session Precautions/Restrictions    FIM:    General    Pain:  Denied pain   Cognition:   Oral/Motor: Oral Motor/Sensory Function Overall Oral Motor/Sensory Function: Impaired Labial ROM: Reduced right Labial Symmetry: Abnormal symmetry right Labial Strength: Within Functional Limits Labial Sensation: Within Functional Limits Lingual ROM: Within Functional Limits Lingual Symmetry: Within Functional Limits Lingual Strength: Within Functional Limits Lingual Sensation: Within Functional Limits Facial ROM: Within Functional Limits Facial Symmetry: Within Functional Limits Facial Strength: Within Functional Limits Facial Sensation: Within Functional Limits Motor Speech Overall Motor Speech: Appears within functional limits for tasks assessed Articulation: Within functional limitis  (modified independent ) Comprehension: Auditory Comprehension Overall Auditory Comprehension: Appears within functional limits for tasks assessed Expression: Expression Primary Mode of Expression: Verbal Verbal Expression Overall Verbal Expression: Appears within functional limits for tasks assessed Written Expression Dominant Hand: Right Written Expression: Unable to assess (comment) (due to upper extremity weakness)  Therapy/Group: Individual Therapy  Royce Macadamia 06/24/2011, 8:21 AM

## 2011-06-25 MED ORDER — TRAZODONE 25 MG HALF TABLET: 25.0000 mg | ORAL_TABLET | Freq: Every day | ORAL | Status: DC

## 2011-06-25 MED ORDER — LORATADINE 10 MG PO TABS
10.0000 mg | ORAL_TABLET | Freq: Every day | ORAL | Status: DC
  Administered 2011-06-25 – 2011-07-03 (×9): 10 mg via ORAL
  Filled 2011-06-25 (×10): qty 1

## 2011-06-25 MED ORDER — TRAZODONE HCL 50 MG PO TABS
25.0000 mg | ORAL_TABLET | Freq: Every day | ORAL | Status: DC
  Administered 2011-06-25 – 2011-07-02 (×8): 50 mg via ORAL
  Filled 2011-06-25 (×12): qty 1

## 2011-06-25 NOTE — Progress Notes (Signed)
Social Work Patient ID: Kimberly Lester, female   DOB: 11-Jan-1931, 78 y.o.   MRN: [id number]  Met with pt to discuss team conference if any concerns she wants brought up.  She is pleased with her progress but  According to her needs to make much more.  She would like to be mod./i level at discharge.  Her husband has a respiratory Infection and has not been here, recovering at home.  Pt voiced no concerns for the team meeting.  Follow up with her tomorrow.

## 2011-06-25 NOTE — Progress Notes (Signed)
Patient ID: Kimberly Lester, female   DOB: Dec 01, 1930, 78 y.o.   MRN: [id number] Subjective/Complaints: Diet upgraded no cough with meals Bowel and Bladder working ok no c/os No complaints of SOB, CP,ABD pain. Review of Systems - Negative except insomia Objective: Vital Signs: Blood pressure 140/82, pulse 55, temperature 98.3 F (36.8 C), temperature source Oral, resp. rate 18, height 5\' 2"  (1.575 m), weight 68.8 kg (151 lb 10.8 oz), SpO2 93.00%. No results found. No results found for this basename: WBC:2,HGB:2,HCT:2,PLT:2 in the last 72 hours No results found for this basename: NA:2,K:2,CL:2,CO2:2,GLUCOSE:2,BUN:2,CREATININE:2,CALCIUM:2 in the last 72 hours CBG (last 3)  No results found for this basename: GLUCAP:3 in the last 72 hours  Wt Readings from Last 3 Encounters:  06/19/11 68.8 kg (151 lb 10.8 oz)  06/19/11 68.493 kg (151 lb)    Physical Exam:  General appearance: alert and no distress Head: Normocephalic, without obvious abnormality, atraumatic Eyes: negative, conjunctivae/corneas clear. PERRL, EOM's intact. Nose: Nares normal. Septum midline. Mucosa normal. No drainage or sinus tenderness. Throat: lips, mucosa, and tongue normal; Full set dentures in place. Neck: supple, no masses Resp: clear to auscultation bilaterally Cardio: regular rate and rhythm GI: soft, non-tender; bowel sounds normal; no masses,  no organomegaly Extremities: extremities normal, atraumatic, no cyanosis or edema Pulses: 2+ and symmetric Skin: Skin color, texture, turgor normal. Mild heat rash getting better. Neurologic: Mental status: Alert, oriented, thought content appropriateRUE weakness with ataxia.  Motor RUE 3-/5 bi,tri,grip RLE 2-/5 hip flex, knee flex, ankle DF/PF  Assessment/Plan: 1. Functional deficits secondary to left pontine infarct  which require 3+ hours per day of interdisciplinary therapy in a comprehensive inpatient rehab setting. Physiatrist is providing close team supervision  and 24 hour management of active medical problems listed below. Physiatrist and rehab team continue to assess barriers to discharge/monitor patient progress toward functional and medical goals. FIM: FIM - Bathing Bathing Steps Patient Completed: Chest;Right Arm;Left Arm;Abdomen;Front perineal area;Buttocks;Right upper leg;Left upper leg;Right lower leg (including foot);Left lower leg (including foot) Bathing: 4: Steadying assist  FIM - Upper Body Dressing/Undressing Upper body dressing/undressing steps patient completed: Thread/unthread right bra strap;Thread/unthread left bra strap;Thread/unthread right sleeve of pullover shirt/dresss;Pull shirt over trunk;Put head through opening of pull over shirt/dress;Thread/unthread left sleeve of pullover shirt/dress Upper body dressing/undressing: 4: Min-Patient completed 75 plus % of tasks FIM - Lower Body Dressing/Undressing Lower body dressing/undressing steps patient completed: Thread/unthread right underwear leg;Thread/unthread left underwear leg;Pull underwear up/down;Thread/unthread right pants leg;Pull pants up/down;Thread/unthread left pants leg Lower body dressing/undressing: 4: Min-Patient completed 75 plus % of tasks  FIM - Toileting Toileting steps completed by patient: Performs perineal hygiene;Adjust clothing after toileting;Adjust clothing prior to toileting Toileting Assistive Devices: Grab bar or rail for support Toileting: 4: Steadying assist  FIM - Diplomatic Services operational officer Devices: Art gallery manager Transfers: 4-To toilet/BSC: Min A (steadying Pt. > 75%)  FIM - Bed/Chair Transfer Bed/Chair Transfer Assistive Devices: Therapist, occupational: 4: Supine > Sit: Min A (steadying Pt. > 75%/lift 1 leg);4: Sit > Supine: Min A (steadying pt. > 75%/lift 1 leg);3: Bed > Chair or W/C: Mod A (lift or lower assist);3: Chair or W/C > Bed: Mod A (lift or lower assist)  FIM - Locomotion: Wheelchair Distance: 150 with cues to  fully extend and flex RLE for full propulsion; patient states that the RLE gets "lazy". Locomotion: Wheelchair: 2: Travels 50 - 149 ft with maximal assistance (Pt: 25 - 49%) FIM - Locomotion: Ambulation Locomotion: Ambulation Assistive Devices: Walker - Rolling;Other (comment) (wall  rail) Ambulation/Gait Assistance: 3: Mod assist Locomotion: Ambulation: 1: Travels less than 50 ft with moderate assistance (Pt: 50 - 74%)  Comprehension Comprehension Mode: Auditory Comprehension: 6-Follows complex conversation/direction: With extra time/assistive device  Expression Expression Mode: Verbal Expression Assistive Devices: 6-Other (Comment) Expression: 6-Expresses complex ideas: With extra time/assistive device  Social Interaction Social Interaction Mode: Asleep Social Interaction: 6-Interacts appropriately with others with medication or extra time (anti-anxiety, antidepressant).  Problem Solving Problem Solving Mode: Not assessed Problem Solving: 6-Solves complex problems: With extra time  Memory Memory Mode: Asleep Memory Assistive Devices: Other (Comment) Memory: 6-More than reasonable amt of time  Medical Problem List and Plan:   1. DVT Prophylaxis/Anticoagulation: Pharmaceutical: Lovenox   2. Pain Management: N/A. Used aleve for arthritic pain. Will use tylenol prn. Some complaints of right knee pain/instability this am.  Will add Voltaren gel.  3. Mood:  Some anxiety noted about deficits and concerns about recovery.  Ego support provided.  Continue ot monitor with ego support by team.   4. HTN: Monitor with bid checks. Blood pressure still borderline.  Monitor for now.  5. Dyslipidemia: continue zocor.  LFTs normal.   6. RLS: Increased reqip helped 7. GERD: Continue PPI.   8. Acute renal insufficiency:  Push fluids as on HCTZ.  Water protocol initiated and should help with hydration.    LOS (Days) 6 A FACE TO FACE EVALUATION WAS PERFORMED  Ahriyah Vannest  E 06/25/2011, 8:51 AM

## 2011-06-25 NOTE — Progress Notes (Signed)
Speech Language Pathology Daily Session Note  Patient Details  Name: Kimberly Lester MRN: [id number] Date of Birth: Jun 25, 1930  Today's Date: 06/25/2011 Time: 4098-1191 Time Calculation (min): 40 min  Short Term Goals: set 06/20/11 Patient will consume recommended diet without observed clinical signs of aspiration with: Modified independent assistance  Patient will utilize recommended strategies during swallow to increase swallowing safety with: Modified independent assistance  Skilled Therapeutic Interventions: Patient consumed regular textures and thin liquids via cup with 2 swallows per sip with supervision semantic cues to initially recall strategies and then use in functional self feeding task.  Patient with attempts to talk after first swallow prior to initiation of second swallow and resulted in cough x1.  RN reports some chest congestion and patient reported coughing during the night; however, no temperature noted and recommend to continue current plan of care at this time due to limited cough x1 with p.o. And patient able to elicit a strong cough response.    Daily Session Precautions/Restrictions    FIM:  Comprehension Comprehension Mode: Auditory Comprehension: 7-Follows complex conversation/direction: With no assist Expression Expression Mode: Verbal Expression: 7-Expresses complex ideas: With no assist Social Interaction Social Interaction: 6-Interacts appropriately with others with medication or extra time (anti-anxiety, antidepressant). Problem Solving Problem Solving: 6-Solves complex problems: With extra time Memory Memory: 6-More than reasonable amt of time FIM - Eating Eating Activity: 5: Supervision/cues General    Pain Pain Assessment Pain Assessment: No/denies pain Pain Score: 0-No pain  Therapy/Group: Individual Therapy  Charlane Ferretti., CCC-SLP [phone]  Antoinne Spadaccini 06/25/2011, 12:25 PM

## 2011-06-25 NOTE — Progress Notes (Signed)
Patient Details  Name: Kimberly Lester MRN: [id number] Date of Birth: 01/02/1931  Today's Date: 06/25/2011 Time: 1130-12  Skilled Therapeutic Interventions/Progress Updates:  Stood and side-stepped with SPC for gardening activity using RUE to water plants.  No c/o pain.   Therapy/Group: Other: co-treat with PT  Activity Level: Moderate:  Level of assist: Min Assist  Kimberly Lester 06/25/2011, 3:27 PM

## 2011-06-25 NOTE — Progress Notes (Signed)
Physical Therapy Session Note  Patient Details  Name: Kimberly Lester MRN: [id number] Date of Birth: Oct 19, 1930  Today's Date: 06/25/2011 Time: 1100-1200 Time Calculation (min): 60 min  Precautions: Precautions Precautions: Fall Precaution Comments: Patient had R THR one year ago; patient states she was still on precautions one year s/p/, will need to verify Required Braces or Orthoses: No Restrictions Weight Bearing Restrictions: No Other Position/Activity Restrictions: Patient had hip replacement 1 year ago and was on total hip precautions, but pt. states that she has not been compliant with precautions.  Short Term Goals: PT Short Term Goal 1: Pt will maintain dynamic standing balance with min A for functional task PT Short Term Goal 1 - Progress: Progressing toward goal PT Short Term Goal 2: Pt will gait with LRAD 100' with min A in controlled environment PT Short Term Goal 2 - Progress: Progressing toward goal PT Short Term Goal 3: Pt will negotiate 2 stairs without handrails for home entry with mod A PT Short Term Goal 3 - Progress: Progressing toward goal   Pain Pain Assessment Pain Assessment: No/denies pain Pain Score: 0-No pain  Locomotion  Ambulation Ambulation: Yes Ambulation/Gait Assistance: 3: Mod assist Ambulation Distance (Feet): 25 Feet Assistive device: Straight cane Ambulation/Gait Assistance Details: Tactile cues for weight shifting;Tactile cues for posture;Verbal cues for sequencing;Verbal cues for technique;Verbal cues for gait pattern;Verbal cues for safe use of DME/AE Ambulation/Gait Assistance Details (indicate cue type and reason): Gait training over thresholds and various floor surfaces (tile and carpet) with SPC and mod A for lateral and anterior weight shifting to RLE and activation of RLE hip and knee extensors and verbal cues needed for increased step/stride length and foot clearance. High Level Ambulation High Level Ambulation: Side stepping;Backwards  walking Side Stepping: with SPC during functional gardening activity Backwards Walking: with SPC during functional gardening activity Stairs / Additional Locomotion Stairs: Yes Stairs Assistance: 2: Max Estate agent Assistance Details: Tactile cues for initiation;Tactile cues for sequencing;Tactile cues for weight shifting;Tactile cues for posture;Tactile cues for placement;Visual cues for safe use of DME/AE;Visual cues/gestures for sequencing;Verbal cues for sequencing;Verbal cues for technique;Verbal cues for precautions/safety;Verbal cues for gait pattern;Verbal cues for safe use of DME/AE Stairs Assistance Details (indicate cue type and reason): Patient does not have rails at home; stair training without rails but with LUE holding SPC and max A for safety, sequence and balance.  Assistance needed for RLE and trunk stabilization in stance, full advancement of RLE and complete forward and lateral weight shifts to prevent posterior LOB; improved sequence and stability with each repetition. Stair Management Technique: No rails;Step to pattern;Forwards;With cane Number of Stairs: 2  (x 3 reps) Height of Stairs: 6   Other Treatments Treatments Therapeutic Activity: Functional gardening activity in standing with focus on R and L lateral stepping for weight shifting and weight acceptance on RLE with UE support on SPC only and focus on use of RUE to hold watering pail while reaching to water plants.    Therapy/Group: Individual Therapy; co-treatment with Recreation therapy  Edman Circle [facility] 06/25/2011, 12:49 PM

## 2011-06-25 NOTE — Progress Notes (Signed)
Occupational Therapy Session Note  Patient Details  Name: Kimberly Lester MRN: [id number] Date of Birth: [birth year]  Today's Date: 06/25/2011 Time: 0900-1000 Time Calculation (min): 60 min  Precautions: Precautions Precautions: Fall Precaution Comments: Patient had R THR one year ago; patient states she was still on precautions one year s/p/, will need to verify Required Braces or Orthoses: No Restrictions Weight Bearing Restrictions: No Other Position/Activity Restrictions: Patient had hip replacement 1 year ago and was on total hip precautions, but pt. states that she has not been compliant with precautions.  Short Term Goals: OT Short Term Goal 1: Patient will transfer to bedside commode with min assist (stand or squat pivot) OT Short Term Goal 2: Patient will dress upper body with mod asist OT Short Term Goal 3: Patient will dress lower body with mod assist OT Short Term Goal 4: Patient will utilize right upper extremity to wash hair, left arm with minimal assist OT Short Term Goal 5: Patient will identify 2-3 grooming feeding tasks which she can effectively complete bimanual   Skilled Therapeutic Interventions/Progress Updates:  Pt seen for ADL retraining with bathing and dressing at shower level.  Pt is at a min assist to steady assist level.  Focus of treatment on RUE active use and balance.  Pt is now using right hand as an active assist.  She is progressing with active ROM throughout the UE.  Pt is having the most difficulty with eating and brushing hair with right hand and fastening her bra.  Pt seen for neuro re-ed to facilitate pinch and grip strength.  Pt able to pick up large pegs and place them in container without assist.  Provided pt with theraputty to use in room.      Pain Pain Assessment Pain Assessment: No/denies pain             Therapy/Group: Individual Therapy  SAGUIER,JULIA 06/25/2011, 11:28 AM

## 2011-06-25 NOTE — Progress Notes (Signed)
Physical Therapy Note  Patient Details  Name: Kimberly Lester MRN: [id number] Date of Birth: 1930-11-08 Today's Date: 06/25/2011  TIME IN/OUT  4098-1191 Individual therapy  Pain- none Skilled Intervention- NMR for forced use RLE and RUE working on activity in standing, weight shifting, moving items with R hand, sidestepping and turning min A for balance   [name] 06/25/2011, 1:23 PM

## 2011-06-26 NOTE — Progress Notes (Signed)
Patient Details  Name: Kimberly Lester MRN: [id number] Date of Birth: 1930-08-07  Today's Date: 06/26/2011 Time: 1130-12 Skilled Therapeutic Interventions/Progress Updates: Ambulated on carpeted and outdoor surfaces including up and down incline using [facility] with Mod assist.  Stood for [employer] therapy activity working on Editor, commissioning and fine motor/active assist of RUE to "dead-head" plants with Min assist.  No c/o pain.  Therapy/Group: Other: co-treat with PT  Activity Level: Moderate:  Level of assist: Min Assist- Mod assist  [name] 06/26/2011, 5:08 PM

## 2011-06-26 NOTE — Progress Notes (Signed)
Occupational Therapy Note  Patient Details  Name: Kimberly Lester MRN: [id number] Date of Birth: 08-21-1930 Today's Date: 06/26/2011  Time: 1410-1440 Pt denies pain Individual Therapy  Pt engaged in standing activities while completing BUE manipulation activities with increased use of RUE/hand.  Pt engaged in functional ambulation activies with SPC to retrieve items with RUE and carry them to receptacle.  Pt exchibits decreased grip strength and decreased distal control with open chain activities.   Kimberly Lester [facility] 06/26/2011, 2:45 PM

## 2011-06-26 NOTE — Progress Notes (Signed)
Occupational Therapy Session Note  Patient Details  Name: Kimberly Lester MRN: [id number] Date of Birth: [birth year]  Today's Date: 06/26/2011 Time: 0902-1000 Time Calculation (min): 58 min  Precautions: Precautions Precautions: Fall Precaution Comments: Patient had R THR one year ago; patient states she was still on precautions one year s/p/, will need to verify Required Braces or Orthoses: No Restrictions Weight Bearing Restrictions: No Other Position/Activity Restrictions: Patient had hip replacement 1 year ago and was on total hip precautions, but pt. states that she has not been compliant with precautions.  Short Term Goals: OT Short Term Goal 1: Patient will transfer to bedside commode with min assist (stand or squat pivot) OT Short Term Goal 2: Patient will dress upper body with mod asist OT Short Term Goal 3: Patient will dress lower body with mod assist OT Short Term Goal 4: Patient will utilize right upper extremity to wash hair, left arm with minimal assist OT Short Term Goal 5: Patient will identify 2-3 grooming feeding tasks which she can effectively complete bimanual  Skilled Therapeutic Interventions/Progress Updates: Pt seen for ADL retraining of bathing/ dressing at shower level with a focus on use of RUE and balance.  Pt did very well using right hand to bathe self, wash hair, partially brush hair, and pull pants up over hips.  She need increased thumb adduction strength to fasten her bra.  Pt is demonstrating improved standing balance of supervision with BADLs.    RUE neuro re-ed with bimanual pole exercises, placing and holding, and pushing/pulling. Pt now able to reach right arm to 120 shoulder flexion and hold for 15 seconds.           Pain Pain Assessment Pain Assessment: No/denies pain Pain Score: 0-No pain             Therapy/Group: Individual Therapy  SAGUIER,JULIA 06/26/2011, 11:17 AM

## 2011-06-26 NOTE — Progress Notes (Signed)
Occupational Therapy Session Note  Patient Details  Name: Yuvonne Lanahan MRN: [id number] Date of Birth: 1930-12-06  Today's Date: 06/26/2011 Time: 1500-1530 Time Calculation (min): 30 min  Precautions: Precautions Precautions: Fall Precaution Comments: Patient had R THR one year ago; patient states she was still on precautions one year s/p/, will need to verify Required Braces or Orthoses: No Restrictions Weight Bearing Restrictions: No Other Position/Activity Restrictions: Patient had hip replacement 1 year ago and was on total hip precautions, but pt. states that she has not been compliant with precautions.  Short Term Goals: OT Short Term Goal 1: Patient will transfer to bedside commode with min assist (stand or squat pivot) OT Short Term Goal 2: Patient will dress upper body with mod asist OT Short Term Goal 3: Patient will dress lower body with mod assist OT Short Term Goal 4: Patient will utilize right upper extremity to wash hair, left arm with minimal assist OT Short Term Goal 5: Patient will identify 2-3 grooming feeding tasks which she can effectively complete bimanual  Skilled Therapeutic Interventions/Progress Updates:    Performed therapeutic exercise for the LUE.  Walked to the therapy gym using a single point cane to use the UE bike.  Performed 4 intervals of 2 minutes each for UE strengthening.  2 set with bilateral UEs and 2 sets with just the RUE.  No resistance applied with any of the intervals.  Focus on full elbow extension without compensation at the shoulder with each revolution as well as sustaining grip.  Progressed to catching and tossing a small beach ball with both UEs without using her body.  Pt with increased difficulty integrating shoulder flexion and extension while reaching greater than 60 degrees to hand the therapist the ball.  Returned to room at the end of the session.  Pain Pain Assessment Pain Assessment: No/denies pain   Therapy/Group: Individual  Therapy  Chemeka Filice OTR/L 06/26/2011, 4:26 PM

## 2011-06-26 NOTE — Progress Notes (Signed)
Speech Language Pathology Daily Session Note  Patient Details  Name: Kimberly Lester MRN: [id number] Date of Birth: Apr 25, 1931  Today's Date: 06/26/2011 Time: 1000-1030 Time Calculation (min): 30 min  Short Term Goals: set 06/20/11  Patient will consume recommended diet without observed clinical signs of aspiration with: Modified independent assistance  Patient will utilize recommended strategies during swallow to increase swallowing safety with: Modified independent assistance  Skilled Therapeutic Interventions: Patient consumed 12oz thin liquid via cup with cough x2 due to increased bolus size and delay in swallow initiation and patient able to verbalize that she could feel the difference and reports that sometimes it is easier to trigger swallow than others.  Patient resistant to trials of thin liquids via straw and after education regarding reasoning of trials paitent consumed 3 single straw sips with no overt s/s of aspiration and consecutive sips with no overt s/s of aspiration; however, patient reported not feeling comfortable with straw at this time. Continue with current plan of care.    Daily Session Precautions/Restrictions    FIM:  Comprehension Comprehension Mode: Auditory Comprehension: 7-Follows complex conversation/direction: With no assist Expression Expression Mode: Verbal Expression: 6-Expresses complex ideas: With extra time/assistive device Social Interaction Social Interaction: 7-Interacts appropriately with others - No medications needed. Problem Solving Problem Solving: 6-Solves complex problems: With extra time Memory Memory: 6-Assistive device: No helper FIM - Eating Eating Activity: 5: Supervision/cues General    Pain Pain Assessment Pain Assessment: No/denies pain Pain Score: 0-No pain  Therapy/Group: Individual Therapy  Charlane Ferretti., CCC-SLP [phone]  Kimberly Lester 06/26/2011, 11:08 AM

## 2011-06-26 NOTE — Progress Notes (Signed)
Physical Therapy Weekly Progress Note  Patient Details  Name: Kimberly Lester MRN: [id number] Date of Birth: [birth year]  Today's Date: 06/26/2011 Time: 1130-1200 and 1600-1630 Time Calculation (min): 30 min and 30 min  Upon evaluation patient was mod A overall for functional transfers, ambulation and max to total A for w/c mobility and stairs. Patient is making excellent progress toward PT LTG and STG and has met 2 of 3 short term goals.  Patient is currently min-mod A for bed mobility on flat bed, no rails, bed <> chair transfers, gait in controlled environment with SPC and mod-max A for w/c mobility with bilat UE use and stairs without railing.  Patient's goals are set for supervision to min A overall and D/C date set for 07/05/11 home with husband and family supervision and assistance.      Patient continues to demonstrate the following deficits: RUE and LE impaired strength and motor control, impaired static and dynamic balance, dynamic gait in controlled, home and community environments and is at increased risk for falls and therefore will continue to benefit from skilled PT intervention to enhance overall performance with balance, functional use of  right upper extremity and right lower extremity, coordination and functional transfers and gait.  Patient progressing toward long term goals..  Continue plan of care.  PT Short Term Goals PT Short Term Goal 1: Pt will maintain dynamic standing balance with min A for functional task PT Short Term Goal 1 - Progress: Met PT Short Term Goal 2: Pt will gait with LRAD 100' with min A in controlled environment PT Short Term Goal 2 - Progress: Met PT Short Term Goal 3: Pt will negotiate 2 stairs without handrails for home entry with mod A PT Short Term Goal 3 - Progress: Progressing toward goal  Therapy Documentation Precautions: Precautions Precautions: Fall Precaution Comments: Patient had R THR one year ago; patient states she was still on precautions  one year s/p/, will need to verify Required Braces or Orthoses: No Restrictions Weight Bearing Restrictions: No Other Position/Activity Restrictions: Patient had hip replacement 1 year ago and was on total hip precautions, but pt. states that she has not been compliant with precautions.  Pain Pain Assessment Pain Assessment: No/denies pain Pain Score: 0-No pain  Locomotion  Ambulation Ambulation: Yes Ambulation/Gait Assistance: 3: Mod assist Ambulation Distance (Feet): 75 Feet Assistive device: Straight cane Ambulation/Gait Assistance Details: Tactile cues for weight shifting;Tactile cues for posture;Visual cues/gestures for precautions/safety;Visual cues for safe use of DME/AE;Visual cues/gestures for sequencing;Verbal cues for sequencing;Verbal cues for precautions/safety;Verbal cues for safe use of DME/AE Ambulation/Gait Assistance Details (indicate cue type and reason): Higher level gait challenges incorporated with SPC and mod A during gait training over carpet, over low thresholds and changes in surfaces, up and down slight incline, over uneven tile surface outdoors, side stepping around raised garden boxes during functional gardening activity to increase coordination and use of RUE with verbal, visual and tactile cues for safe use of AD, upright posture, increase step and stride length LLE with increased foot clearance and stance time RLE and assistance for forward weight shift onto RLE during stance. PM session: continued gait training with SPC on level surfaces with mod A and verbal cues for sequence with SPC and manual facilitation for full R lateral and anterior weight shift and cues for increased stance time on RLE. Balance Standardized Balance Assessment Standardized Balance Assessment: Berg Balance Test See navigator for individual tasks; patient scored 40/56 on Berg indicating significant risk for falls (80%);  discussed falls risk with patient and implications for use of AD;  patient does present with improved balance and endurance with RW but decreased function in home environment; will continue to assess most functional AD for D/C  Therapy/Group: Individual Therapy  Edman Circle [facility] 06/26/2011, 12:24 PM

## 2011-06-26 NOTE — Plan of Care (Signed)
Problem: RH KNOWLEDGE DEFICIT Goal: RH STG INCREASE KNOWLEDGE OF DYSPHAGIA/FLUID INTAKE Min assist of caregiver  Outcome: Progressing Regular, thin, full Supervisioin secondary to coughing , needs cue to stop wait till done coughing before starting again

## 2011-06-26 NOTE — Progress Notes (Signed)
Patient ID: Kimberly Lester, female   DOB: 05/09/1931, 78 y.o.   MRN: [id number] Subjective/Complaints: Diet upgraded no cough with meals Bowel and Bladder working ok no c/os No complaints of SOB, CP,ABD pain. Review of Systems - Negative except insomia Objective: Vital Signs: Blood pressure 165/80, pulse 72, temperature 98.7 F (37.1 C), temperature source Oral, resp. rate 20, height 5\' 2"  (1.575 m), weight 68.8 kg (151 lb 10.8 oz), SpO2 95.00%. No results found. No results found for this basename: WBC:2,HGB:2,HCT:2,PLT:2 in the last 72 hours No results found for this basename: NA:2,K:2,CL:2,CO2:2,GLUCOSE:2,BUN:2,CREATININE:2,CALCIUM:2 in the last 72 hours CBG (last 3)  No results found for this basename: GLUCAP:3 in the last 72 hours  Wt Readings from Last 3 Encounters:  06/19/11 68.8 kg (151 lb 10.8 oz)  06/19/11 68.493 kg (151 lb)    Physical Exam:  General appearance: alert and no distress Head: Normocephalic, without obvious abnormality, atraumatic Eyes: negative, conjunctivae/corneas clear. PERRL, EOM's intact. Nose: Nares normal. Septum midline. Mucosa normal. No drainage or sinus tenderness. Throat: lips, mucosa, and tongue normal; Full set dentures in place. Neck: supple, no masses Resp: clear to auscultation bilaterally Cardio: regular rate and rhythm GI: soft, non-tender; bowel sounds normal; no masses,  no organomegaly Extremities: extremities normal, atraumatic, no cyanosis or edema Pulses: 2+ and symmetric Skin: Skin color, texture, turgor normal. Mild heat rash getting better. Neurologic: Mental status: Alert, oriented, thought content appropriateRUE weakness with ataxia.  Motor RUE 3-/5 bi,tri,grip RLE 3-/5 hip flex, knee flex, ankle DF/PF  Assessment/Plan: 1. Functional deficits secondary to left pontine infarct  which require 3+ hours per day of interdisciplinary therapy in a comprehensive inpatient rehab setting. Physiatrist is providing close team supervision  and 24 hour management of active medical problems listed below. Physiatrist and rehab team continue to assess barriers to discharge/monitor patient progress toward functional and medical goals. FIM: FIM - Bathing Bathing Steps Patient Completed: Chest;Right Arm;Left Arm;Abdomen;Front perineal area;Left lower leg (including foot);Buttocks;Right upper leg;Left upper leg;Right lower leg (including foot) Bathing: 5: Supervision: Safety issues/verbal cues  FIM - Upper Body Dressing/Undressing Upper body dressing/undressing steps patient completed: Thread/unthread left bra strap;Thread/unthread right bra strap;Thread/unthread right sleeve of pullover shirt/dresss;Thread/unthread left sleeve of pullover shirt/dress;Put head through opening of pull over shirt/dress;Pull shirt over trunk Upper body dressing/undressing: 4: Min-Patient completed 75 plus % of tasks FIM - Lower Body Dressing/Undressing Lower body dressing/undressing steps patient completed: Thread/unthread right underwear leg;Pull underwear up/down;Thread/unthread left underwear leg;Thread/unthread right pants leg;Thread/unthread left pants leg;Pull pants up/down Lower body dressing/undressing: 4: Min-Patient completed 75 plus % of tasks  FIM - Toileting Toileting steps completed by patient: Adjust clothing prior to toileting;Performs perineal hygiene;Adjust clothing after toileting Toileting Assistive Devices: Grab bar or rail for support Toileting: 4: Steadying assist  FIM - Diplomatic Services operational officer Devices: Art gallery manager Transfers: 4-To toilet/BSC: Min A (steadying Pt. > 75%)  FIM - Bed/Chair Transfer Bed/Chair Transfer Assistive Devices: Therapist, occupational: 3: Bed > Chair or W/C: Mod A (lift or lower assist);3: Chair or W/C > Bed: Mod A (lift or lower assist)  FIM - Locomotion: Wheelchair Distance: 150 Locomotion: Wheelchair: 1: Total Assistance/staff pushes wheelchair (Pt<25%) FIM - Locomotion:  Ambulation Locomotion: Ambulation Assistive Devices: Emergency planning/management officer Ambulation/Gait Assistance: 3: Mod assist Locomotion: Ambulation: 1: Travels less than 50 ft with moderate assistance (Pt: 50 - 74%)  Comprehension Comprehension Mode: Auditory Comprehension: 7-Follows complex conversation/direction: With no assist  Expression Expression Mode: Verbal Expression Assistive Devices: 6-Other (Comment) Expression: 6-Expresses complex ideas: With  extra time/assistive device  Social Interaction Social Interaction Mode: Asleep Social Interaction: 7-Interacts appropriately with others - No medications needed.  Problem Solving Problem Solving Mode: Not assessed Problem Solving: 6-Solves complex problems: With extra time  Memory Memory Mode: Asleep Memory Assistive Devices: Other (Comment) Memory: 6-Assistive device: No helper  Medical Problem List and Plan:   1. DVT Prophylaxis/Anticoagulation: Pharmaceutical: Lovenox   2. Pain Management: N/A. Used aleve for arthritic pain. Will use tylenol prn. Some complaints of right knee pain/instability this am.  Will add Voltaren gel.  3. Mood:  Some anxiety noted about deficits and concerns about recovery.  Ego support provided.  Continue ot monitor with ego support by team.   4. HTN: Monitor with bid checks. Blood pressure still borderline.  Monitor for now.  5. Dyslipidemia: continue zocor.  LFTs normal.   6. RLS: Increased reqip helped 7. GERD: Continue PPI.   8. Acute renal insufficiency:  Push fluids as on HCTZ.  Water protocol initiated and should help with hydration.    LOS (Days) 7 A FACE TO FACE EVALUATION WAS PERFORMED  Sumiye Hirth E 06/26/2011, 12:08 PM

## 2011-06-27 NOTE — Progress Notes (Signed)
Speech Language Pathology Weekly Progress Note  Patient Details  Name: Kimberly Lester MRN: [id number] Date of Birth: Dec 20, 1930  Today's Date: 06/27/2011  Short Term Goals: Week 1:   Patient will consume recommended diet without observed clinical signs of aspiration with: Modified independent assistance (Goal Met) Patient will utilize recommended strategies during swallow to increase swallowing safety with: Modified independent assistance (Goal Met)  Week 2: SLP Short Term Goal 1 (Week 2): Patient will consume regular textures and thin liquids with modified independent use of compensaory strategies and no overt s/s of aspiration SLP Short Term Goal 2 (Week 2): Patient will perform pharyngeal strenghting exercises with modified independence  SLP Short Term Goal 3 (Week 2): Patient will participate in trials of thin liquids via staw and medicaition with thin liquid with supervision verbal cues  Weekly Progress Updates: Patient has progressed from consuming Dys.3 textures with nectar-thick liquids to regular textures with thin liquids with occasional cough following sips.  Trials with thin liquids via straw and medication administered with thin liquids continue to result in a delayed cough response and as a result patient would continue to benefit from skilled SLP services to maximize swallow function at a frequency of 3 times a week.    SLP Frequency: 3 out of 7 days;1-2 X/day, 30-60 minutes SLP Treatment/Interventions: Dysphagia/aspiration precaution training;Functional tasks;Patient/family education  Daily Session Precautions/Restrictions  Cognition: Overall Cognitive Status: Appears within functional limits for tasks assessed Oral/Motor: Oral Motor/Sensory Function Overall Oral Motor/Sensory Function: Impaired Labial ROM: Reduced right Labial Symmetry: Within Functional Limits;Abnormal symmetry right Labial Strength: Within Functional Limits Labial Sensation: Within Functional  Limits Lingual ROM: Within Functional Limits Lingual Symmetry: Within Functional Limits Lingual Strength: Within Functional Limits Lingual Sensation: Within Functional Limits Facial ROM: Within Functional Limits Facial Symmetry: Within Functional Limits Facial Strength: Within Functional Limits Facial Sensation: Within Functional Limits Motor Speech Overall Motor Speech: Appears within functional limits for tasks assessed Articulation: Within functional limitis Comprehension: Auditory Comprehension Overall Auditory Comprehension: Appears within functional limits for tasks assessed Expression: Expression Primary Mode of Expression: Verbal Verbal Expression Overall Verbal Expression: Appears within functional limits for tasks assessed Written Expression Dominant Hand: Right Written Expression: Exceptions to [facility]  Fae Pippin, M.A., CCC-SLP [phone]  Kimberly Lester 06/27/2011, 11:31 AM

## 2011-06-27 NOTE — Progress Notes (Signed)
Speech Language Pathology Daily Session Note  Patient Details  Name: Kimberly Lester MRN: [id number] Date of Birth: 1931/03/26  Today's Date: 06/27/2011 Time: 0800-0845 Time Calculation (min): 45 min  Short Term Goals: set 06/20/11  Patient will consume recommended diet without observed clinical signs of aspiration with: Modified independent assistance  Patient will utilize recommended strategies during swallow to increase swallowing safety with: Modified independent assistance  Skilled Therapeutic Interventions: Session focused on diagnostic treatment of swallow function.  Patient required minimal assist with set up and consumed Dys.3 textures with thin liquids via cup with no overt s/s of aspiration.  SLP consulted patient and recommended intermittent staff supervision with meals.  RN present for medication administration and trials of medication administered whole with thin via cup and cough response in 2/2 trials.  SLP educated both RN and patient regarding suspected penetration of thin liquids when consumed as a mixed consistency and rationale for continued need for medication administration whole in puree.    Daily Session Precautions/Restrictions    FIM:  Comprehension Comprehension Mode: Auditory Comprehension: 7-Follows complex conversation/direction: With no assist Expression Expression Mode: Verbal Expression: 6-Expresses complex ideas: With extra time/assistive device Social Interaction Social Interaction: 7-Interacts appropriately with others - No medications needed. Problem Solving Problem Solving: 6-Solves complex problems: With extra time Memory Memory: 6-More than reasonable amt of time FIM - Eating Eating Activity: 5: Set-up assist for open containers General    Pain Pain Assessment Pain Assessment: No/denies pain Pain Score: 0-No pain  Therapy/Group: Individual Therapy  Charlane Ferretti., CCC-SLP [phone]  Jorel Gravlin 06/27/2011, 8:42 AM

## 2011-06-27 NOTE — Progress Notes (Signed)
Patient ID: Kimberly Lester, female   DOB: [birth year], 78 y.o.   MRN: [id number] Subjective/Complaints: Diet upgraded no cough with meals Bowel and Bladder working ok no c/os No complaints of SOB, CP,ABD pain. Review of Systems - Negative except insomia Objective: Vital Signs: Blood pressure 158/94, pulse 67, temperature 99 F (37.2 C), temperature source Oral, resp. rate 18, height 5\' 2"  (1.575 m), weight 68.8 kg (151 lb 10.8 oz), SpO2 91.00%. No results found. No results found for this basename: WBC:2,HGB:2,HCT:2,PLT:2 in the last 72 hours No results found for this basename: NA:2,K:2,CL:2,CO2:2,GLUCOSE:2,BUN:2,CREATININE:2,CALCIUM:2 in the last 72 hours CBG (last 3)  No results found for this basename: GLUCAP:3 in the last 72 hours  Wt Readings from Last 3 Encounters:  06/19/11 68.8 kg (151 lb 10.8 oz)  06/19/11 68.493 kg (151 lb)    Physical Exam:  General appearance: alert and no distress Head: Normocephalic, without obvious abnormality, atraumatic Eyes: negative, conjunctivae/corneas clear. PERRL, EOM's intact. Nose: Nares normal. Septum midline. Mucosa normal. No drainage or sinus tenderness. Throat: lips, mucosa, and tongue normal; Full set dentures in place. Neck: supple, no masses Resp: clear to auscultation bilaterally Cardio: regular rate and rhythm GI: soft, non-tender; bowel sounds normal; no masses,  no organomegaly Extremities: extremities normal, atraumatic, no cyanosis or edema Pulses: 2+ and symmetric Skin: Skin color, texture, turgor normal. Mild heat rash getting better. Neurologic: Mental status: Alert, oriented, thought content appropriateRUE weakness with ataxia.  Motor RUE 3-/5 bi,tri,grip RLE 3-/5 hip flex, knee flex, ankle DF/PF  Assessment/Plan: 1. Functional deficits secondary to left pontine infarct  which require 3+ hours per day of interdisciplinary therapy in a comprehensive inpatient rehab setting. Physiatrist is providing close team supervision and  24 hour management of active medical problems listed below. Physiatrist and rehab team continue to assess barriers to discharge/monitor patient progress toward functional and medical goals. FIM: FIM - Bathing Bathing Steps Patient Completed: Chest;Right Arm;Left Arm;Abdomen;Front perineal area;Left lower leg (including foot);Buttocks;Right upper leg;Left upper leg;Right lower leg (including foot) Bathing: 5: Supervision: Safety issues/verbal cues  FIM - Upper Body Dressing/Undressing Upper body dressing/undressing steps patient completed: Thread/unthread left bra strap;Thread/unthread right bra strap;Thread/unthread right sleeve of pullover shirt/dresss;Thread/unthread left sleeve of pullover shirt/dress;Put head through opening of pull over shirt/dress;Pull shirt over trunk Upper body dressing/undressing: 4: Min-Patient completed 75 plus % of tasks FIM - Lower Body Dressing/Undressing Lower body dressing/undressing steps patient completed: Thread/unthread right underwear leg;Pull underwear up/down;Thread/unthread left underwear leg;Thread/unthread right pants leg;Thread/unthread left pants leg;Pull pants up/down Lower body dressing/undressing: 4: Min-Patient completed 75 plus % of tasks  FIM - Toileting Toileting steps completed by patient: Adjust clothing prior to toileting Toileting Assistive Devices: Grab bar or rail for support Toileting: 6: More than reasonable amount of time  FIM - Diplomatic Services operational officer Devices: Grab bars Toilet Transfers: 4-To toilet/BSC: Min A (steadying Pt. > 75%)  FIM - Banker Devices: Teacher, music: 3: Bed > Chair or W/C: Mod A (lift or lower assist);3: Chair or W/C > Bed: Mod A (lift or lower assist)  FIM - Locomotion: Wheelchair Distance: 150 Locomotion: Wheelchair: 1: Total Assistance/staff pushes wheelchair (Pt<25%) FIM - Locomotion: Ambulation Locomotion: Ambulation Assistive  Devices: Emergency planning/management officer Ambulation/Gait Assistance: 3: Mod assist Locomotion: Ambulation: 2: Travels 50 - 149 ft with moderate assistance (Pt: 50 - 74%)  Comprehension Comprehension Mode: Auditory Comprehension: 7-Follows complex conversation/direction: With no assist  Expression Expression Mode: Verbal Expression Assistive Devices: 6-Other (Comment) Expression: 6-Expresses complex ideas: With  extra time/assistive device  Social Interaction Social Interaction Mode: Asleep Social Interaction: 7-Interacts appropriately with others - No medications needed.  Problem Solving Problem Solving Mode: Not assessed Problem Solving: 6-Solves complex problems: With extra time  Memory Memory Mode: Asleep Memory Assistive Devices: Other (Comment) Memory: 6-More than reasonable amt of time  Medical Problem List and Plan:   1. DVT Prophylaxis/Anticoagulation: Pharmaceutical: Lovenox   2. Pain Management: N/A. Used aleve for arthritic pain. Will use tylenol prn. Some complaints of right knee pain/instability this am.  Will add Voltaren gel.  3. Mood:  Some anxiety noted about deficits and concerns about recovery.  Ego support provided.  Continue ot monitor with ego support by team.   4. HTN: Monitor with bid checks. Blood pressure still with occ systolic elevation.  Monitor for now.  5. Dyslipidemia: continue zocor.  LFTs normal.   6. RLS: Increased reqip helped 7. GERD: Continue PPI.   8. Acute renal insufficiency:  Push fluids as on HCTZ.  Water protocol initiated and should help with hydration.    LOS (Days) 8 A FACE TO FACE EVALUATION WAS PERFORMED  KIRSTEINS,ANDREW E 06/27/2011, 8:56 AM

## 2011-06-27 NOTE — Progress Notes (Signed)
Patient Details  Name: Kimberly Lester MRN: [id number] Date of Birth: [birth year]  Today's Date: 06/27/2011 Time:1030-11  Skilled Therapeutic Interventions/Progress Updates: Ambulated with RW from room to ADL apartment with close supervision. Pt with LOB x1 needing Min assist to recover.  Stood at counter to look at [employer] using RUE to turn pages working on standing endurance.  No c/o pain.  Therapy/Group: Individual Therapy  Activity Level: Simple:  Level of assist: Supervision  Chaela Branscum 06/27/2011, 12:11 PM

## 2011-06-27 NOTE — Progress Notes (Signed)
Per State Regulation 482.30 This chart was reviewed for medical necessity with respect to the patient's Admission/Duration of stay. Pt motivated and participating in therapies with steady progress. Monitoring BP.  Met with pt, her daughter and granddtr to report on team conference. Explained long term goals and provided info on short-term goals at dtr's request. Discussed d/c plans. All pleased with pt's progress and in agreement with d/c date of 2/15.  Meryl Dare                 Nurse Care Manager            Next Review Date: 07/01/11

## 2011-06-27 NOTE — Patient Care Conference (Signed)
Inpatient RehabilitationTeam Conference Note Date: 06/26/2011   Time: 10:35 AM    Patient Name: Kimberly Lester      Medical Record Number: [id number]  Date of Birth: 1931/05/03 Sex: Female         Room/Bed: 4147/4147-01 Payor Info: Payor: Advertising copywriter MEDICARE  Plan: [employer] MEDICARE COMPLETE  Product Type: *No Product type*     Admitting Diagnosis: LT CVA  Admit Date/Time:  06/19/2011  3:43 PM Admission Comments: No comment available   Primary Diagnosis:  <principal problem not specified> Principal Problem: <principal problem not specified>  Patient Active Problem List  Diagnoses Date Noted  . CVA (cerebral infarction) 06/19/2011    Expected Discharge Date: Expected Discharge Date: 07/05/11  Team Members Present: Physician: Dr. Claudette Laws Case Manager Present: Lutricia Horsfall, RN Social Worker Present: Dossie Der, LCSW Nurse Present: Gregor Hams, RN PT Present: Edman Circle, PT OT Present: Bretta Bang, OT Other (Discipline and Name): Tora Duck, RN, PPS Coord     Current Status/Progress Goal Weekly Team Focus  Medical   BPs controlled, afebrile  maintain stable vitals  cont current meds and monitor   Bowel/Bladder   Continent of bowel and bladder  Continent of bowel and bladder  Monitor   Swallow/Nutrition/ Hydration   regular thin with full supervision   least restrictive p.o. intake  increase self monitoring and carryover   ADL's   progressing rapidly with RUE active movement and functional use, min assist to steady assist with ADLs  min assist with dressing, mod I with toileting and bathing, supervision with shower stall transfers  ADL retraining, RUE neuro re-ed, balance activities, pt/ family education   Mobility   min A transfers, min-mod A gait training with SPC, max A stairs with SPC  supervision overall except min A stairs  balance, gait, stairs   Communication             Safety/Cognition/ Behavioral Observations            Pain   No c/o  pain  <3  Monitor   Skin   Bruising of abdomen and arm  No skin breakdown  Routine turn q 2hrs. Assess for decrease to ecchymotic areas      *See Interdisciplinary Assessment and Plan and progress notes for long and short-term goals  Barriers to Discharge: no medical barriers    Possible Resolutions to Barriers:  cont current management    Discharge Planning/Teaching Needs:  Home with hisband and daughter's to assist since husband not able too. Supportive Family      Team Discussion:  Pt's BP fluctuates. No orthostatic sx. Good po intake. Pt very cautious with hip precautions and swallow technique.  Continent but with urinary urgency. Discussed d/c plan.  Revisions to Treatment Plan:  none   Continued Need for Acute Rehabilitation Level of Care: The patient requires daily medical management by a physician with specialized training in physical medicine and rehabilitation for the following conditions: Daily direction of a multidisciplinary physical rehabilitation program to ensure safe treatment while eliciting the highest outcome that is of practical value to the patient.: Yes Daily medical management of patient stability for increased activity during participation in an intensive rehabilitation regime.: Yes Daily analysis of laboratory values and/or radiology reports with any subsequent need for medication adjustment of medical intervention for : Neurological problems  Meryl Dare 06/27/2011, 10:21 AM

## 2011-06-27 NOTE — Progress Notes (Signed)
Nutrition Follow-up  Pt upgraded to Regular diet with thin liquids on 2/7. Eating 75-100% of meals.  Diet Order:  Regular with thins  Meds: Scheduled Meds:   . antiseptic oral rinse  15 mL Mouth Rinse BID  . azelastine  1 spray Each Nare BID  . clopidogrel  75 mg Oral Q breakfast  . enoxaparin  40 mg Subcutaneous Q24H  . hydrochlorothiazide  12.5 mg Oral Daily  . lisinopril  20 mg Oral Daily  . loratadine  10 mg Oral Daily  . metoprolol tartrate  50 mg Oral Daily  . pantoprazole  40 mg Oral Q1200  . rOPINIRole  0.5 mg Oral QHS  . senna  2 tablet Oral QHS  . simvastatin  40 mg Oral q1800  . traZODone  25-50 mg Oral QHS   Continuous Infusions:  PRN Meds:.acetaminophen, alum & mag hydroxide-simeth, bisacodyl, diphenhydrAMINE, food thickener, guaiFENesin-dextromethorphan, Muscle Rub, polyethylene glycol, promethazine, promethazine, promethazine  Labs:  CMP     Component Value Date/Time   NA 142 06/20/2011 0600   K 3.7 06/20/2011 0600   CL 106 06/20/2011 0600   CO2 25 06/20/2011 0600   GLUCOSE 106* 06/20/2011 0600   BUN 24* 06/20/2011 0600   CREATININE 0.61 06/20/2011 0600   CALCIUM 9.5 06/20/2011 0600   PROT 6.4 06/20/2011 0600   ALBUMIN 3.4* 06/20/2011 0600   AST 16 06/20/2011 0600   ALT 18 06/20/2011 0600   ALKPHOS 68 06/20/2011 0600   BILITOT 0.4 06/20/2011 0600   GFRNONAA 85* 06/20/2011 0600   GFRAA >90 06/20/2011 0600     Intake/Output Summary (Last 24 hours) at 06/27/11 1056 Last data filed at 06/27/11 0800  Gross per 24 hour  Intake   2140 ml  Output      0 ml  Net   2140 ml    Weight Status:  68.8 kg, no new wt  Estimated needs:  1450 - 1600 kcal, 75 - 85 grams  Nutrition Dx:  Swallowing difficulty - resolved.  Goal:  Pt to consume >/= 90% of estimated needs with meals. Met.  Intervention:  RD to follow nutrition care plan, pt eating well.  Monitor:  Weights, labs, PO intake, I/O's  Adair Laundry Pager #:  [phone]

## 2011-06-27 NOTE — Progress Notes (Signed)
Physical Therapy Session Note  Patient Details  Name: Kimberly Lester MRN: [id number] Date of Birth: 01-14-1931  Today's Date: 06/27/2011 Time: 1100-1159 and 0865-7846 Time Calculation (min): 59 min and 27 min  Precautions: Precautions Precautions: Fall Precaution Comments: Patient had R THR one year ago; patient states she was still on precautions one year s/p/, will need to verify Required Braces or Orthoses: No Restrictions Weight Bearing Restrictions: No Other Position/Activity Restrictions: Patient had hip replacement 1 year ago and was on total hip precautions, but pt. states that she has not been compliant with precautions.  Short Term Goals: PT Short Term Goal 1: Pt will maintain dynamic standing balance with min A for functional task PT Short Term Goal 1 - Progress: Met PT Short Term Goal 2: Pt will gait with LRAD 100' with min A in controlled environment PT Short Term Goal 2 - Progress: Met PT Short Term Goal 3: Pt will negotiate 2 stairs without handrails for home entry with mod A PT Short Term Goal 3 - Progress: Progressing toward goal  Pain Pain Assessment Pain Assessment: No/denies pain Locomotion  Ambulation Ambulation: Yes Ambulation/Gait Assistance: 3: Mod assist Ambulation Distance (Feet): 225 Feet Assistive device: Other (Comment) (treadmill) Ambulation/Gait Assistance Details: Tactile cues for sequencing;Tactile cues for weight shifting;Tactile cues for posture;Verbal cues for sequencing;Manual facilitation for weight shifting Ambulation/Gait Assistance Details (indicate cue type and reason): Gait training on treadmill x 2 reps at 0.5 mph x 5 min at 185 ft and 225 ft with verbal, visual and tactile cues for increased stance time on RLE, R foot DF and full step and stride length.   Other Treatments Treatments Neuromuscular Facilitation: Right;Lower Extremity;Forced use;Activity to increase timing and sequencing;Activity to increase sustained activation standing  closed chain hip ABD sidestepping to R pushing weight object with RLE for forced activation of RLE glute medius for stability in stance.  PM session: patient asking if she can ambulate alone in her room; discussed falls risk again and reinforced to patient the importance of calling for assistance to prevent falls.  Reviewed simulated car transfer and patient gave repeat demonstration with RW and supervision; reviewed with patient sequence for floor > furniture transfer in case of fall at home; patient gave repeat demonstration with mod A   Therapy/Group: Individual Therapy  Edman Circle [facility] 06/27/2011, 12:57 PM

## 2011-06-27 NOTE — Progress Notes (Signed)
Occupational Therapy Weekly Progress Note and Skilled Therapy Intervention  Patient Details  Name: Kimberly Lester MRN: [id number] Date of Birth: 1931/02/12  Today's Date: 06/27/2011 Time: 0905-1005 Time Calculation (min): 60 min  Skilled Therapy Intervention:  Pt seen for ADL retraining of BADLs with a focus on RUE active use.  She continues to have difficulty fastening bra.  Pt worked on RUE shoulder arom with wall shoulder glides and wall push ups.  Pinch strength activities with clothespins and therapy putty.  Patient has met 5 of 5 short term goals.  Pt is also progressing well with LTGs.  Patient continues to demonstrate the following deficits: decreased standing balance and right hemiparesis and therefore will continue to benefit from skilled OT intervention to enhance overall performance with ADL.  Patient progressing toward long term goals..  Continue plan of care.  OT Short Term Goals OT Short Term Goal 1: Patient will transfer to bedside commode with min assist (stand or squat pivot) OT Short Term Goal 1 - Progress: Met OT Short Term Goal 2: Patient will dress upper body with mod asist OT Short Term Goal 2 - Progress: Met OT Short Term Goal 3: Patient will dress lower body with mod assist OT Short Term Goal 3 - Progress: Met OT Short Term Goal 4: Patient will utilize right upper extremity to wash hair, left arm with minimal assist OT Short Term Goal 4 - Progress: Met OT Short Term Goal 5: Patient will identify 2-3 grooming feeding tasks which she can effectively complete bimanual OT Short Term Goal 5 - Progress: Met  Therapy Documentation Precautions: Precautions Precautions: Fall Precaution Comments: Patient had R THR one year ago; patient states she was still on precautions one year s/p/, will need to verify Required Braces or Orthoses: No Restrictions Weight Bearing Restrictions: No Other Position/Activity Restrictions: Patient had hip replacement 1 year ago and was on  total hip precautions, but pt. states that she has not been compliant with precautions.  Skilled Therapeutic Interventions/Progress Updates:  Charity fundraiser education;Therapeutic Activities;Neuromuscular re-education;Functional mobility training;Self Care/advanced ADL retraining;Therapeutic Exercise;UE/LE Strength taining/ROM;UE/LE Coordination activities         Pain Pain Assessment Pain Assessment: No/denies pain Pain Score: 0-No pain ADL ADL Eating: Set up Where Assessed-Eating: Chair Grooming: Supervision/safety Where Assessed-Grooming: Standing at sink Upper Body Bathing: Supervision/safety Where Assessed-Upper Body Bathing: Shower Lower Body Bathing: Supervision/safety Where Assessed-Lower Body Bathing: Shower Upper Body Dressing: Minimal assistance Where Assessed-Upper Body Dressing: Chair Lower Body Dressing: Minimal assistance Where Assessed-Lower Body Dressing: Chair Toileting: Supervision/safety Where Assessed-Toileting: Teacher, adult education: Close supervision Statistician Method: Event organiser: Close supervision Film/video editor Method: Designer, industrial/product: Information systems manager with back;Grab bars      Other Treatments Treatments Neuromuscular Facilitation: Right;Upper Extremity;Forced use;Activity to increase motor control  Therapy/Group: Individual Therapy  SAGUIER,JULIA 06/27/2011, 12:04 PM

## 2011-06-28 DIAGNOSIS — I6330 Cerebral infarction due to thrombosis of unspecified cerebral artery: Secondary | ICD-10-CM

## 2011-06-28 DIAGNOSIS — Z5189 Encounter for other specified aftercare: Secondary | ICD-10-CM

## 2011-06-28 DIAGNOSIS — G8110 Spastic hemiplegia affecting unspecified side: Secondary | ICD-10-CM

## 2011-06-28 LAB — BASIC METABOLIC PANEL
Calcium: 9.8 mg/dL (ref 8.4–10.5)
GFR calc Af Amer: >90 mL/min (ref >90)
GFR calc non Af Amer: 81 mL/min — ABNORMAL LOW (ref >90)
Glucose, Bld: 107 mg/dL — ABNORMAL HIGH (ref 70–99)
Potassium: 3.6 mEq/L (ref 3.5–5.1)
Sodium: 138 mEq/L (ref 135–145)

## 2011-06-28 NOTE — Progress Notes (Signed)
Occupational Therapy Session Note  Patient Details  Name: Kimberly Lester MRN: [id number] Date of Birth: 27-Mar-1931  Today's Date: 06/28/2011 Time: 4098-1191 Time Calculation (min): 60 min  Precautions: Precautions Precautions: Fall Precaution Comments: Patient had R THR one year ago; patient states she was still on precautions one year s/p/, will need to verify Required Braces or Orthoses: No Restrictions Weight Bearing Restrictions: No Other Position/Activity Restrictions: Patient had hip replacement 1 year ago and was on total hip precautions, but pt. states that she has not been compliant with precautions.  Short Term Goals: OT Short Term Goal 1: Pt will toilet with modified independence. OT Short Term Goal 1 - Progress: Progressing toward goal OT Short Term Goal 2: Pt will transfer to toilet with modified independence. OT Short Term Goal 2 - Progress: Progressing toward goal OT Short Term Goal 3: Pt will use RUE as dominant assist with bimanual homemaking/ kitchen activities. OT Short Term Goal 3 - Progress: Progressing toward goal OT Short Term Goal 4: Patient will utilize right upper extremity to wash hair, left arm with minimal assist OT Short Term Goal 4 - Progress: Met OT Short Term Goal 5: Patient will identify 2-3 grooming feeding tasks which she can effectively complete bimanual OT Short Term Goal 5 - Progress: Met  Skilled Therapeutic Interventions/Progress Updates: Pt seen for BADL retraining with focus on using RUE as a dominant assist.  Pt needs assist to fasten bra in front but then she can use both hands to twist bra around to back.  RUE AROM and neuro re-ed with focus on shoulder AROM and strengthening and fine motor coordination.  Pt stated she has bilateral arthritis and has not been able to make a full fist or do activities like peel potatoes for years.  Pt is progressing rapidly.           Pain Pain Assessment Pain Assessment: No/denies pain Pain Score: 0-No  pain           Therapy/Group: Individual Therapy  Chriselda Leppert 06/28/2011, 11:23 AM

## 2011-06-28 NOTE — Progress Notes (Signed)
Patient ID: Kimberly Lester, female   DOB: 11-10-1930, 78 y.o.   MRN: [id number] Subjective/Complaints: Diet upgraded no cough with meals Bowel and Bladder working ok no c/os No complaints of SOB, CP,ABD pain. Review of Systems - Negative except insomia Objective: Vital Signs: Blood pressure 157/74, pulse 73, temperature 98.1 F (36.7 C), temperature source Oral, resp. rate 18, height 5\' 2"  (1.575 m), weight 70.2 kg (154 lb 12.2 oz), SpO2 97.00%. No results found. No results found for this basename: WBC:2,HGB:2,HCT:2,PLT:2 in the last 72 hours No results found for this basename: NA:2,K:2,CL:2,CO2:2,GLUCOSE:2,BUN:2,CREATININE:2,CALCIUM:2 in the last 72 hours CBG (last 3)  No results found for this basename: GLUCAP:3 in the last 72 hours  Wt Readings from Last 3 Encounters:  06/27/11 70.2 kg (154 lb 12.2 oz)  06/19/11 68.493 kg (151 lb)    Physical Exam:  General appearance: alert and no distress Head: Normocephalic, without obvious abnormality, atraumatic Eyes: negative, conjunctivae/corneas clear. PERRL, EOM's intact. Nose: Nares normal. Septum midline. Mucosa normal. No drainage or sinus tenderness. Throat: lips, mucosa, and tongue normal; Full set dentures in place. Neck: supple, no masses Resp: clear to auscultation bilaterally Cardio: regular rate and rhythm GI: soft, non-tender; bowel sounds normal; no masses,  no organomegaly Extremities: extremities normal, atraumatic, no cyanosis or edema Pulses: 2+ and symmetric Skin: Skin color, texture, turgor normal. Mild heat rash getting better. Neurologic: Mental status: Alert, oriented, thought content appropriateRUE weakness with ataxia.  Motor RUE 3-/5 bi,tri,grip RLE 3-/5 hip flex, knee flex, ankle DF/PF  Assessment/Plan: 1. Functional deficits secondary to left pontine infarct  which require 3+ hours per day of interdisciplinary therapy in a comprehensive inpatient rehab setting. Physiatrist is providing close team supervision  and 24 hour management of active medical problems listed below. Physiatrist and rehab team continue to assess barriers to discharge/monitor patient progress toward functional and medical goals. FIM: FIM - Bathing Bathing Steps Patient Completed: Chest;Right Arm;Left Arm;Abdomen;Front perineal area;Left lower leg (including foot);Buttocks;Right upper leg;Left upper leg;Right lower leg (including foot) Bathing: 5: Supervision: Safety issues/verbal cues  FIM - Upper Body Dressing/Undressing Upper body dressing/undressing steps patient completed: Thread/unthread left bra strap;Thread/unthread right bra strap;Thread/unthread right sleeve of pullover shirt/dresss;Thread/unthread left sleeve of pullover shirt/dress;Put head through opening of pull over shirt/dress;Pull shirt over trunk Upper body dressing/undressing: 4: Min-Patient completed 75 plus % of tasks FIM - Lower Body Dressing/Undressing Lower body dressing/undressing steps patient completed: Thread/unthread right underwear leg;Pull underwear up/down;Thread/unthread left underwear leg;Thread/unthread right pants leg;Thread/unthread left pants leg;Pull pants up/down Lower body dressing/undressing: 4: Min-Patient completed 75 plus % of tasks  FIM - Toileting Toileting steps completed by patient: Adjust clothing prior to toileting;Performs perineal hygiene Toileting Assistive Devices: Grab bar or rail for support Toileting: 4: Steadying assist  FIM - Diplomatic Services operational officer Devices: Therapist, music Transfers: 5-To toilet/BSC: Supervision (verbal cues/safety issues)  FIM - Banker Devices: Therapist, occupational: 4: Supine > Sit: Min A (steadying Pt. > 75%/lift 1 leg);4: Sit > Supine: Min A (steadying pt. > 75%/lift 1 leg)  FIM - Locomotion: Wheelchair Distance: 150 Locomotion: Wheelchair: 1: Total Assistance/staff pushes wheelchair (Pt<25%) FIM - Locomotion:  Ambulation Locomotion: Ambulation Assistive Devices: Other (comment) (treadmill) Ambulation/Gait Assistance: 3: Mod assist Locomotion: Ambulation: 3: Travels 150 ft or more with moderate assistance (Pt: 50 - 74%)  Comprehension Comprehension Mode: Auditory Comprehension: 7-Follows complex conversation/direction: With no assist  Expression Expression Mode: Verbal Expression Assistive Devices: 6-Other (Comment) Expression: 6-Expresses complex ideas: With extra time/assistive device  Social Interaction Social Interaction Mode: Asleep Social Interaction: 7-Interacts appropriately with others - No medications needed.  Problem Solving Problem Solving Mode: Not assessed Problem Solving: 6-Solves complex problems: With extra time  Memory Memory Mode: Asleep Memory Assistive Devices: Other (Comment) Memory: 6-More than reasonable amt of time  Medical Problem List and Plan:   1. DVT Prophylaxis/Anticoagulation: Pharmaceutical: Lovenox   2. Pain Management: N/A. Used aleve for arthritic pain. Will use tylenol prn. Some complaints of right knee pain/instability this am.  Will add Voltaren gel.  3. Mood:  Some anxiety noted about deficits and concerns about recovery.  Ego support provided.  Continue ot monitor with ego support by team.   4. HTN: Monitor with bid checks. Blood pressure still with occ systolic elevation.  Monitor for now.  5. Dyslipidemia: continue zocor.  LFTs normal.   6. RLS: Increased reqip helped 7. GERD: Continue PPI.   8. Acute renal insufficiency:  Push fluids as on HCTZ.  Water protocol initiated and should help with hydration.Recheck BMET    LOS (Days) 9 A FACE TO FACE EVALUATION WAS PERFORMED  KIRSTEINS,ANDREW E 06/28/2011, 8:15 AM

## 2011-06-28 NOTE — Progress Notes (Signed)
Physical Therapy Session Note  Patient Details  Name: Kimberly Lester MRN: [id number] Date of Birth: November 15, 1930  Today's Date: 06/28/2011 Time: 4098-1191 Time Calculation (min): 56 min  Precautions: Precautions Precautions: Fall Precaution Comments: Patient had R THR one year ago; patient states she was still on precautions one year s/p/, will need to verify Required Braces or Orthoses: No Restrictions Weight Bearing Restrictions: No Other Position/Activity Restrictions: Patient had hip replacement 1 year ago and was on total hip precautions, but pt. states that she has not been compliant with precautions.  Skilled Therapeutic Interventions/Progress Updates:  Pain Pain Assessment Pain Assessment: No/denies pain Pain Score: 0-No pain Mobility Transfers Transfers: Yes Sit to Stand: 4: Min assist Locomotion  Ambulation Ambulation/Gait Assistance: 4: Min assist Ambulation Distance (Feet): 100 Feet Assistive device: Rolling walker Gait without device on unit with overall mod@ due to LOB x 100' x2 Gait Gait: Yes Gait Pattern: Impaired Gait Pattern: Decreased step length - right;Decreased step length - left (narrow base of support) Stairs / Additional Locomotion Stairs: Yes Stairs Assistance: 4: Min assist;3: Mod assist Stairs Assistance Details (indicate cue type and reason): progressed from holding both rails to not holding rails during 3 trials. LOB descending steps without rails. Number of Stairs: 15  Height of Stairs: 6       Balance Dynamic Standing Balance Dynamic Standing - Level of Assistance: 4: Min assist (folding towels = s, making bed=mod@)   Other Treatments Treatments Therapeutic Activity: Stepping onto step with RLE sideways to address R hip abduction strength.  Foam balance board with theraband to address balance balance reactions, hip/ankle and trunk stabilization.  Therapy/Group: Individual Therapy  Georges Mouse 06/28/2011, 12:23 PM

## 2011-06-28 NOTE — Progress Notes (Signed)
Occupational Therapy Session Note  Patient Details  Name: Kimberly Lester MRN: [id number] Date of Birth: [birth year]  Today's Date: 06/28/2011 Time: 4098-1191 Time Calculation (min): 45 min  Precautions: Precautions Precautions: Fall Precaution Comments: Patient had R THR one year ago; patient states she was still on precautions one year s/p/, will need to verify Required Braces or Orthoses: No Restrictions Weight Bearing Restrictions: No Other Position/Activity Restrictions: Patient had hip replacement 1 year ago and was on total hip precautions, but pt. states that she has not been compliant with precautions.  Short Term Goals: OT Short Term Goal 1: Pt will toilet with modified independence. OT Short Term Goal 1 - Progress: Progressing toward goal OT Short Term Goal 2: Pt will transfer to toilet with modified independence. OT Short Term Goal 2 - Progress: Progressing toward goal OT Short Term Goal 3: Pt will use RUE as dominant assist with bimanual homemaking/ kitchen activities. OT Short Term Goal 3 - Progress: Progressing toward goal OT Short Term Goal 4: Patient will utilize right upper extremity to wash hair, left arm with minimal assist OT Short Term Goal 4 - Progress: Met OT Short Term Goal 5: Patient will identify 2-3 grooming feeding tasks which she can effectively complete bimanual OT Short Term Goal 5 - Progress: Met  Skilled Therapeutic Interventions/Progress Updates:    Neuromuscular re-education for right UE: activity to work on proximal strength with normal patterns of movement: shoulder flexion/ extension with long pipe with gripper in two different planes, finger ladder with scapular depression, wall push ups, finger manipulation with therapy putty for isolated finger movement.  Pain Pain Assessment Pain Assessment: No/denies pain  Therapy/Group: Individual Therapy  Melonie Florida 06/28/2011, 3:58 PM

## 2011-06-28 NOTE — Progress Notes (Signed)
Speech Language Pathology Daily Session Note  Patient Details  Name: Kimberly Lester MRN: [id number] Date of Birth: May 13, 1931  Today's Date: 06/28/2011 Time: 0865-7846 Time Calculation (min): 35 min  Short Term Goals: Week 2: SLP Short Term Goal 1 (Week 2): Patient will consume regular textures and thin liquids with modified independent use of compensaory strategies and no overt s/s of aspiration SLP Short Term Goal 2 (Week 2): Patient will perform pharyngeal strenghting exercises with modified independence  SLP Short Term Goal 3 (Week 2): Patient will participate in trials of thin liquids via staw and medicaition with thin liquid with supervision verbal cues  Skilled Therapeutic Interventions: Patient consumed thin liquids via straw with no overt s/s of aspiration; however, it appeared more effortful and patient reported that she did not feel as comfortable; RN administered medication and trials of whole with thin liquids sips from the cup resulted in a cough response 1/5 trials and SLP cued for a hard effortful cough response.  Recommend for medication to be administered whole with thin liquid vis cup sips with one pill at a time.  Educated patient on advocating for self by requesting no straws and to it up prior to any p.o.    Daily Session Precautions/Restrictions    FIM:  Comprehension Comprehension Mode: Auditory Comprehension: 7-Follows complex conversation/direction: With no assist Expression Expression Mode: Verbal Expression: 6-Expresses complex ideas: With extra time/assistive device Social Interaction Social Interaction: 7-Interacts appropriately with others - No medications needed. Problem Solving Problem Solving: 7-Solves complex problems: Recognizes & self-corrects Memory Memory: 6-Assistive device: No helper FIM - Eating Eating Activity: 6: Swallowing techniques: self-managed General    Pain Pain Assessment Pain Assessment: No/denies pain Pain Score: 0-No  pain  Therapy/Group: Individual Therapy  Charlane Ferretti., CCC-SLP [phone]  Tifany Hirsch 06/28/2011, 11:06 AM

## 2011-06-28 NOTE — Progress Notes (Signed)
Social Work Patient ID: Kimberly Lester, female   DOB: Aug 25, 1930, 78 y.o.   MRN: [id number]  Met with pt to discuss her progress.  She is pleased with how well she is doing but still stumbles at times. Her husband is feeling better and will plan to come up next week to go through therapies with pt in preparation for discharge 2/15.  No other concerns expressed by pt, continue to work on discharge plans.

## 2011-06-29 NOTE — Progress Notes (Signed)
Physical Therapy Note  Patient Details  Name: Amore Ackman MRN: [id number] Date of Birth: 1930/06/06 Today's Date: 06/29/2011  1300-1355 (55 minutes) group Pain- no complaint of pain Treatment: Pt participated in PT group to improve gait safety/endurance; Pt ambulated 160 feet X 1 with RW close supervision and 160 feet X 2 without AD to facilitate balance reactions SBA.   Alcides Nutting,JIM 06/29/2011, 3:11 PM

## 2011-06-29 NOTE — Progress Notes (Signed)
Patient ID: Kimberly Lester, female   DOB: 09/23/1930, 78 y.o.   MRN: [id number] Subjective/Complaints: Diet upgraded no cough with meals Bowel and Bladder working ok no c/os No complaints of SOB, CP,ABD pain. Review of Systems - Negative except insomia Objective: Vital Signs: Blood pressure 131/71, pulse 79, temperature 98.2 F (36.8 C), temperature source Oral, resp. rate 18, height 5\' 2"  (1.575 m), weight 70.2 kg (154 lb 12.2 oz), SpO2 92.00%. No results found. No results found for this basename: WBC:2,HGB:2,HCT:2,PLT:2 in the last 72 hours  Basename 06/28/11 1230  NA 138  K 3.6  CL 101  CO2 27  GLUCOSE 107*  BUN 17  CREATININE 0.69  CALCIUM 9.8   CBG (last 3)  No results found for this basename: GLUCAP:3 in the last 72 hours  Wt Readings from Last 3 Encounters:  06/27/11 70.2 kg (154 lb 12.2 oz)  06/19/11 68.493 kg (151 lb)    Physical Exam:  General appearance: alert and no distress Head: Normocephalic, without obvious abnormality, atraumatic Eyes: negative, conjunctivae/corneas clear. PERRL, EOM's intact. Nose: Nares normal. Septum midline. Mucosa normal. No drainage or sinus tenderness. Throat: lips, mucosa, and tongue normal; Full set dentures in place. Neck: supple, no masses Resp: clear to auscultation bilaterally Cardio: regular rate and rhythm GI: soft, non-tender; bowel sounds normal; no masses,  no organomegaly Extremities: extremities normal, atraumatic, no cyanosis or edema Pulses: 2+ and symmetric Skin: Skin color, texture, turgor normal. Mild heat rash getting better. Neurologic: Mental status: Alert, oriented, thought content appropriateRUE weakness with ataxia.  Motor RUE 3-/5 bi,tri,grip RLE 3-/5 hip flex, knee flex, ankle DF/PF  Assessment/Plan: 1. Functional deficits secondary to left pontine infarct  which require 3+ hours per day of interdisciplinary therapy in a comprehensive inpatient rehab setting. Physiatrist is providing close team  supervision and 24 hour management of active medical problems listed below. Physiatrist and rehab team continue to assess barriers to discharge/monitor patient progress toward functional and medical goals. FIM: FIM - Bathing Bathing Steps Patient Completed: Chest;Right Arm;Left Arm;Abdomen;Front perineal area;Left lower leg (including foot);Buttocks;Right upper leg;Left upper leg;Right lower leg (including foot) Bathing: 6: More than reasonable amount of time  FIM - Upper Body Dressing/Undressing Upper body dressing/undressing steps patient completed: Thread/unthread right bra strap;Thread/unthread left bra strap;Thread/unthread right sleeve of pullover shirt/dresss;Thread/unthread left sleeve of pullover shirt/dress;Put head through opening of pull over shirt/dress;Pull shirt over trunk Upper body dressing/undressing: 4: Min-Patient completed 75 plus % of tasks FIM - Lower Body Dressing/Undressing Lower body dressing/undressing steps patient completed: Thread/unthread right underwear leg;Thread/unthread left underwear leg;Pull underwear up/down;Pull pants up/down;Thread/unthread left pants leg;Thread/unthread right pants leg Lower body dressing/undressing: 4: Min-Patient completed 75 plus % of tasks  FIM - Toileting Toileting steps completed by patient: Adjust clothing prior to toileting;Performs perineal hygiene;Adjust clothing after toileting Toileting Assistive Devices: Grab bar or rail for support Toileting: 5: Supervision: Safety issues/verbal cues  FIM - Diplomatic Services operational officer Devices: Art gallery manager Transfers: 5-To toilet/BSC: Supervision (verbal cues/safety issues)  FIM - Banker Devices: Therapist, occupational: 4: Bed > Chair or W/C: Min A (steadying Pt. > 75%)  FIM - Locomotion: Wheelchair Distance: 150 Locomotion: Wheelchair: 1: Total Assistance/staff pushes wheelchair (Pt<25%) FIM - Locomotion:  Ambulation Locomotion: Ambulation Assistive Devices: Designer, industrial/product Ambulation/Gait Assistance: 4: Min assist Locomotion: Ambulation: 2: Travels 50 - 149 ft with minimal assistance (Pt.>75%)  Comprehension Comprehension Mode: Auditory Comprehension: 7-Follows complex conversation/direction: With no assist  Expression Expression Mode: Verbal Expression Assistive Devices: 6-Other (  Comment) Expression: 6-Expresses complex ideas: With extra time/assistive device  Social Interaction Social Interaction Mode: Asleep Social Interaction: 7-Interacts appropriately with others - No medications needed.  Problem Solving Problem Solving Mode: Not assessed Problem Solving: 7-Solves complex problems: Recognizes & self-corrects  Memory Memory Mode: Asleep Memory Assistive Devices: Other (Comment) Memory: 6-Assistive device: No helper  Medical Problem List and Plan:   1. DVT Prophylaxis/Anticoagulation: Pharmaceutical: Lovenox   2. Pain Management: N/A. Used aleve for arthritic pain. Will use tylenol prn. Some complaints of right knee pain/instability this am.  Will add Voltaren gel.  3. Mood:  Some anxiety noted about deficits and concerns about recovery.  Ego support provided.  Continue ot monitor with ego support by team.   4. HTN: Monitor with bid checks. Blood pressure still with occ systolic elevation.  Monitor for now.  5. Dyslipidemia: continue zocor.  LFTs normal.   6. RLS: Increased reqip helped 7. GERD: Continue PPI.   8. Acute renal insufficiency:  Push fluids as on HCTZ.  Water protocol initiated and should help with hydration.Recheck BMET improved    LOS (Days) 10 A FACE TO FACE EVALUATION WAS PERFORMED  Ivah Girardot E 06/29/2011, 10:47 AM

## 2011-06-30 NOTE — Progress Notes (Signed)
Occupational Therapy Session Note  Patient Details  Name: Kimberly Lester MRN: [id number] Date of Birth: November 11, 1930  Today's Date: 06/30/2011 Time: 4098-1191 Time Calculation (min): 60 min  Precautions: Precautions Precautions: Fall Precaution Comments: Patient had R THR one year ago; patient states she was still on precautions one year s/p/, will need to verify Required Braces or Orthoses: No Restrictions Weight Bearing Restrictions: No Other Position/Activity Restrictions: Patient had hip replacement 1 year ago and was on total hip precautions, but pt. states that she has not been compliant with precautions.  Short Term Goals: OT Short Term Goal 1: Pt will toilet with modified independence. OT Short Term Goal 1 - Progress: Progressing toward goal OT Short Term Goal 2: Pt will transfer to toilet with modified independence. OT Short Term Goal 2 - Progress: Progressing toward goal OT Short Term Goal 3: Pt will use RUE as dominant assist with bimanual homemaking/ kitchen activities. OT Short Term Goal 3 - Progress: Progressing toward goal OT Short Term Goal 4: Patient will utilize right upper extremity to wash hair, left arm with minimal assist OT Short Term Goal 4 - Progress: Met OT Short Term Goal 5: Patient will identify 2-3 grooming feeding tasks which she can effectively complete bimanual OT Short Term Goal 5 - Progress: Met  Skilled Therapeutic Interventions/Progress Updates:    pt. Engaged in therapeutic activities and right neuromuscular re education utilizing kitchen activity.  Pt. Used RUE as diminished level dominant side.  Pt. Ambulated around kitchen with no AD and close supervision.     Pain:  none    Therapy/Group: Individual Therapy  Humberto Seals 06/30/2011, 2:52 PM

## 2011-06-30 NOTE — Progress Notes (Signed)
Patient ID: Kimberly Lester, female   DOB: Apr 27, 1931, 78 y.o.   MRN: [id number] Subjective/Complaints: Diet upgraded no cough with meals Bowel and Bladder working ok no c/os No complaints of SOB, CP,ABD pain. Review of Systems - Negative except insomia Objective: Vital Signs: Blood pressure 147/76, pulse 69, temperature 97.9 F (36.6 C), temperature source Oral, resp. rate 18, height 5\' 2"  (1.575 m), weight 70.2 kg (154 lb 12.2 oz), SpO2 92.00%. No results found. No results found for this basename: WBC:2,HGB:2,HCT:2,PLT:2 in the last 72 hours  Basename 06/28/11 1230  NA 138  K 3.6  CL 101  CO2 27  GLUCOSE 107*  BUN 17  CREATININE 0.69  CALCIUM 9.8   CBG (last 3)  No results found for this basename: GLUCAP:3 in the last 72 hours  Wt Readings from Last 3 Encounters:  06/27/11 70.2 kg (154 lb 12.2 oz)  06/19/11 68.493 kg (151 lb)    Physical Exam:  General appearance: alert and no distress Head: Normocephalic, without obvious abnormality, atraumatic Eyes: negative, conjunctivae/corneas clear. PERRL, EOM's intact. Nose: Nares normal. Septum midline. Mucosa normal. No drainage or sinus tenderness. Throat: lips, mucosa, and tongue normal; Full set dentures in place. Neck: supple, no masses Resp: clear to auscultation bilaterally Cardio: regular rate and rhythm GI: soft, non-tender; bowel sounds normal; no masses,  no organomegaly Extremities: extremities normal, atraumatic, no cyanosis or edema Pulses: 2+ and symmetric Skin: Skin color, texture, turgor normal. Mild heat rash getting better. Neurologic: Mental status: Alert, oriented, thought content appropriateRUE weakness with ataxia.  Motor RUE 4-/5 bi,tri,grip RLE 4-/5 hip flex, knee flex, ankle DF/PF  Assessment/Plan: 1. Functional deficits secondary to left pontine infarct  which require 3+ hours per day of interdisciplinary therapy in a comprehensive inpatient rehab setting. Physiatrist is providing close team  supervision and 24 hour management of active medical problems listed below. Physiatrist and rehab team continue to assess barriers to discharge/monitor patient progress toward functional and medical goals. FIM: FIM - Bathing Bathing Steps Patient Completed: Chest;Right Arm;Left Arm;Abdomen;Front perineal area;Buttocks;Right upper leg;Left upper leg;Right lower leg (including foot);Left lower leg (including foot) Bathing: 5: Supervision: Safety issues/verbal cues  FIM - Upper Body Dressing/Undressing Upper body dressing/undressing steps patient completed: Thread/unthread right bra strap;Thread/unthread left bra strap;Thread/unthread right sleeve of pullover shirt/dresss;Thread/unthread left sleeve of pullover shirt/dress;Put head through opening of pull over shirt/dress;Pull shirt over trunk Upper body dressing/undressing: 5: Set-up assist to: Obtain clothing/put away FIM - Lower Body Dressing/Undressing Lower body dressing/undressing steps patient completed: Thread/unthread right underwear leg;Thread/unthread left underwear leg;Pull underwear up/down;Thread/unthread right pants leg;Don/Doff left sock;Don/Doff right sock Lower body dressing/undressing: 5: Set-up assist to: Obtain clothing  FIM - Toileting Toileting steps completed by patient: Adjust clothing prior to toileting;Performs perineal hygiene;Adjust clothing after toileting Toileting Assistive Devices: Grab bar or rail for support Toileting: 5: Supervision: Safety issues/verbal cues  FIM - Diplomatic Services operational officer Devices: Grab bars;Walker Toilet Transfers: 5-To toilet/BSC: Supervision (verbal cues/safety issues)  FIM - Banker Devices: Therapist, occupational: 0: Activity did not occur  FIM - Locomotion: Wheelchair Distance: 150 Locomotion: Wheelchair: 0: Activity did not occur FIM - Locomotion: Ambulation Locomotion: Ambulation Assistive Devices: Dealer Ambulation/Gait Assistance: 5: Supervision Locomotion: Ambulation: 5: Travels 150 ft or more with supervision/safety issues  Comprehension Comprehension Mode: Auditory Comprehension: 7-Follows complex conversation/direction: With no assist  Expression Expression Mode: Verbal Expression Assistive Devices: 6-Other (Comment) Expression: 6-Expresses complex ideas: With extra time/assistive device  Social Interaction Social Interaction Mode:  Asleep Social Interaction: 7-Interacts appropriately with others - No medications needed.  Problem Solving Problem Solving Mode: Not assessed Problem Solving: 7-Solves complex problems: Recognizes & self-corrects  Memory Memory Mode: Asleep Memory Assistive Devices: Other (Comment) Memory: 6-Assistive device: No helper  Medical Problem List and Plan:   1. DVT Prophylaxis/Anticoagulation: Pharmaceutical: Lovenox   2. Pain Management: N/A. Used aleve for arthritic pain. Will use tylenol prn. Some complaints of right knee pain/instability this am.  Will add Voltaren gel.  3. Mood:  Some anxiety noted about deficits and concerns about recovery.  Ego support provided.  Continue ot monitor with ego support by team.   4. HTN: Monitor with bid checks. Blood pressure still with occ systolic elevation.  Monitor for now.  5. Dyslipidemia: continue zocor.  LFTs normal.   6. RLS: Increased reqip helped 7. GERD: Continue PPI.   8. Acute renal insufficiency:  Push fluids as on HCTZ.  Water protocol initiated and should help with hydration.Recheck BMET improved    LOS (Days) 11 A FACE TO FACE EVALUATION WAS PERFORMED  Zachory Mangual E 06/30/2011, 9:52 AM

## 2011-07-01 NOTE — Progress Notes (Signed)
Occupational Therapy Session Note  Patient Details  Name: Kimberly Lester MRN: [id number] Date of Birth: 1931/05/11  Today's Date: 07/01/2011 Time: 4098-1191 Time Calculation (min): 50 min  Precautions: Precautions Precautions: Fall Precaution Comments: Patient had R THR one year ago; patient states she was still on precautions one year s/p/, will need to verify Required Braces or Orthoses: No Restrictions Weight Bearing Restrictions: No Other Position/Activity Restrictions: Patient had hip replacement 1 year ago and was on total hip precautions, but pt. states that she has not been compliant with precautions.  Short Term Goals: OT Short Term Goal 1: Pt will toilet with modified independence. OT Short Term Goal 1 - Progress: Progressing toward goal OT Short Term Goal 2: Pt will transfer to toilet with modified independence. OT Short Term Goal 2 - Progress: Progressing toward goal OT Short Term Goal 3: Pt will use RUE as dominant assist with bimanual homemaking/ kitchen activities. OT Short Term Goal 3 - Progress: Progressing toward goal OT Short Term Goal 4: Patient will utilize right upper extremity to wash hair, left arm with minimal assist OT Short Term Goal 4 - Progress: Met OT Short Term Goal 5: Patient will identify 2-3 grooming feeding tasks which she can effectively complete bimanual OT Short Term Goal 5 - Progress: Met  Skilled Therapeutic Interventions/Progress Updates: Pt seen for ADL retraining of BADLs at shower level with a focus on standing balance and use of RUE.  Patient's standing balance has improved to distant supervision and she is able to transfer with distant supervision with walker.  Pt uses RUE as a dominant assist except for fine motor tasks of fastening bra.  Pt worked on Therapist, music tasks without the walker to reach into cupboards and refrigerator with supervision.  Discussed safety issues with patient trying to lift heavy objects such as pasta pot or  casserole dish.  She was advised against this and to only perform light cooking with supervision.        Pain Pain Assessment Pain Assessment: No/denies pain        Therapy/Group: Individual Therapy  Zakayla Martinec 07/01/2011, 11:04 AM

## 2011-07-01 NOTE — Progress Notes (Signed)
Up to bathroom to void and had moderate amount of stool. Reports right side of scalp feels funny. "My hair hurts". Calling appropriately for assistance. Slept good last night after taking scheduled trazadone 50mg . Tawanna Solo

## 2011-07-01 NOTE — Progress Notes (Signed)
Physical Therapy Note  Patient Details  Name: Tammye Kahler MRN: [id number] Date of Birth: 1930/05/27 Today's Date: 07/01/2011  1000-1055 (55 minutes) individual Pain- no complaint of pain Focus of treatment: Therapeutic activities and gait training without AD to challenge dynamic standing balance Treatment: Gait without AD min to close supervision 120 feet X 2 with occ. Stagger to right; Therapeutic activities including Standing on 4 inch board to facilitate ankle balance reactions min assist X 2 minutes ; stepping over 4 inch board X 10 alternating LEs with assist to maintain balance /limited single leg stance time on right or left ; Standing with alternate LE on 6 inch step to facilitate increased  weight bearing on either LE.   Bernice Mcauliffe,JIM 07/01/2011, 10:58 AM

## 2011-07-01 NOTE — Progress Notes (Signed)
Patient ID: Kimberly Lester, female   DOB: [birth year], 78 y.o.   MRN: [id number] Subjective/Complaints: Is krill oil good for me.  Triglycerides normal.  Low HDL, hi LDL on Crestor.  No need for fish oil Bowel and Bladder working ok no c/os No complaints of SOB, CP,ABD pain. Review of Systems - Negative except insomia Objective: Vital Signs: Blood pressure 154/77, pulse 77, temperature 98.9 F (37.2 C), temperature source Oral, resp. rate 18, height 5\' 2"  (1.575 m), weight 70.2 kg (154 lb 12.2 oz), SpO2 93.00%. No results found. No results found for this basename: WBC:2,HGB:2,HCT:2,PLT:2 in the last 72 hours  Basename 06/28/11 1230  NA 138  K 3.6  CL 101  CO2 27  GLUCOSE 107*  BUN 17  CREATININE 0.69  CALCIUM 9.8   CBG (last 3)  No results found for this basename: GLUCAP:3 in the last 72 hours  Wt Readings from Last 3 Encounters:  06/27/11 70.2 kg (154 lb 12.2 oz)  06/19/11 68.493 kg (151 lb)    Physical Exam:  General appearance: alert and no distress Head: Normocephalic, without obvious abnormality, atraumatic Eyes: negative, conjunctivae/corneas clear. PERRL, EOM's intact. Nose: Nares normal. Septum midline. Mucosa normal. No drainage or sinus tenderness. Throat: lips, mucosa, and tongue normal; Full set dentures in place. Neck: supple, no masses Resp: clear to auscultation bilaterally Cardio: regular rate and rhythm GI: soft, non-tender; bowel sounds normal; no masses,  no organomegaly Extremities: extremities normal, atraumatic, no cyanosis or edema Pulses: 2+ and symmetric Skin: Skin color, texture, turgor normal. Mild heat rash getting better. Neurologic: Mental status: Alert, oriented, thought content appropriateRUE weakness with ataxia.  Motor RUE 4-/5 bi,tri,grip RLE 4-/5 hip flex, knee flex, ankle DF/PF  Assessment/Plan: 1. Functional deficits secondary to left pontine infarct  which require 3+ hours per day of interdisciplinary therapy in a comprehensive  inpatient rehab setting. Physiatrist is providing close team supervision and 24 hour management of active medical problems listed below. Physiatrist and rehab team continue to assess barriers to discharge/monitor patient progress toward functional and medical goals. FIM: FIM - Bathing Bathing Steps Patient Completed: Chest;Right Arm;Left Arm;Abdomen;Front perineal area;Buttocks;Right upper leg;Left upper leg;Right lower leg (including foot);Left lower leg (including foot) Bathing: 5: Supervision: Safety issues/verbal cues  FIM - Upper Body Dressing/Undressing Upper body dressing/undressing steps patient completed: Thread/unthread right bra strap;Thread/unthread left bra strap;Hook/unhook bra;Thread/unthread right sleeve of pullover shirt/dresss;Thread/unthread left sleeve of pullover shirt/dress;Put head through opening of pull over shirt/dress;Pull shirt over trunk Upper body dressing/undressing: 5: Set-up assist to: Obtain clothing/put away FIM - Lower Body Dressing/Undressing Lower body dressing/undressing steps patient completed: Thread/unthread right underwear leg;Thread/unthread left underwear leg;Pull underwear up/down;Thread/unthread right pants leg;Thread/unthread left pants leg;Pull pants up/down;Fasten/unfasten pants Lower body dressing/undressing: 5: Set-up assist to: Obtain clothing  FIM - Toileting Toileting steps completed by patient: Adjust clothing prior to toileting;Performs perineal hygiene;Adjust clothing after toileting Toileting Assistive Devices: Grab bar or rail for support Toileting: 5: Supervision: Safety issues/verbal cues  FIM - Diplomatic Services operational officer Devices: Elevated toilet seat;Grab bars Toilet Transfers: 5-To toilet/BSC: Supervision (verbal cues/safety issues)  FIM - Banker Devices: Therapist, occupational: 5: Bed > Chair or W/C: Supervision (verbal cues/safety issues)  FIM - Locomotion:  Wheelchair Distance: 150 Locomotion: Wheelchair: 0: Activity did not occur FIM - Locomotion: Ambulation Locomotion: Ambulation Assistive Devices: Designer, industrial/product Ambulation/Gait Assistance: 5: Supervision Locomotion: Ambulation: 5: Travels 150 ft or more with supervision/safety issues  Comprehension Comprehension Mode: Auditory Comprehension: 7-Follows complex conversation/direction: With  no assist  Expression Expression Mode: Verbal Expression Assistive Devices: 6-Other (Comment) Expression: 6-Expresses complex ideas: With extra time/assistive device  Social Interaction Social Interaction Mode: Asleep Social Interaction: 7-Interacts appropriately with others - No medications needed.  Problem Solving Problem Solving Mode: Not assessed Problem Solving: 7-Solves complex problems: Recognizes & self-corrects  Memory Memory Mode: Asleep Memory Assistive Devices: Other (Comment) Memory: 6-More than reasonable amt of time  Medical Problem List and Plan:   1. DVT Prophylaxis/Anticoagulation: Pharmaceutical: Lovenox   2. Pain Management: N/A. Used aleve for arthritic pain. Will use tylenol prn. Some complaints of right knee pain/instability this am.  Will add Voltaren gel.  3. Mood:  Some anxiety noted about deficits and concerns about recovery.  Ego support provided.  Continue ot monitor with ego support by team.   4. HTN: Monitor with bid checks. Blood pressure still with occ systolic elevation.  Monitor for now.  5. Dyslipidemia: continue crestor.  LFTs normal.   6. RLS: Increased reqip helped 7. GERD: Continue PPI.   8. Acute renal insufficiency:  Push fluids as on HCTZ.  Water protocol initiated and should help with hydration.Recheck BMET improved    LOS (Days) 12 A FACE TO FACE EVALUATION WAS PERFORMED  KIRSTEINS,ANDREW E 07/01/2011, 8:14 AM

## 2011-07-01 NOTE — Progress Notes (Signed)
Speech Language Pathology Daily Session Note  Patient Details  Name: Kimberly Lester MRN: [id number] Date of Birth: Jun 15, 1930  Today's Date: 07/01/2011 Time: 1415-1500 Time Calculation (min): 45 min  Short Term Goals: Week 2: SLP Short Term Goal 1 (Week 2): Patient will consume regular textures and thin liquids with modified independent use of compensaory strategies and no overt s/s of aspiration SLP Short Term Goal 2 (Week 2): Patient will perform pharyngeal strenghting exercises with modified independence  SLP Short Term Goal 3 (Week 2): Patient will participate in trials of thin liquids via staw and medicaition with thin liquid with supervision verbal cues  Skilled Therapeutic Interventions: Session focused on diagnostic treatment of swallow function.  Pt. And RN report no difficulties with p.o. Intake.  Patient consumed thin liquids via single straw sips with no overt s/s of aspiration and consecutive sips with cough response in 1/3 trails.  Patient educated on universal precautions and recommend that she advocate for herself by determining method of intake for medication and thin liquids.  Removed all precautions in orders and plan on following up x1 then will discharge.    Daily Session Precautions/Restrictions    FIM:  Comprehension Comprehension Mode: Auditory Comprehension: 7-Follows complex conversation/direction: With no assist Expression Expression Mode: Verbal Expression: 7-Expresses complex ideas: With no assist Social Interaction Social Interaction: 7-Interacts appropriately with others - No medications needed. Problem Solving Problem Solving: 7-Solves complex problems: Recognizes & self-corrects Memory Memory: 6-Assistive device: No helper FIM - Eating Eating Activity: 6: Swallowing techniques: self-managed General    Pain Pain Assessment Pain Assessment: No/denies pain Pain Score: 0-No pain  Therapy/Group: Individual Therapy  Fae Pippin, M.A.,  CCC-SLP [phone]

## 2011-07-01 NOTE — Progress Notes (Signed)
Physical Therapy Session Note  Patient Details  Name: Kimberly Lester MRN: [id number] Date of Birth: Sep 20, 1930  Today's Date: 07/01/2011 Time: 1191-4782 Time Calculation (min): 44 min  Precautions: Precautions Precautions: Fall Precaution Comments: Patient had R THR one year ago; patient states she was still on precautions one year s/p/, will need to verify Required Braces or Orthoses: No Restrictions Weight Bearing Restrictions: No Other Position/Activity Restrictions: Patient had hip replacement 1 year ago and was on total hip precautions, but pt. states that she has not been compliant with precautions.  Short Term Goals: PT Short Term Goal 1: Pt will maintain dynamic standing balance with min A for functional task PT Short Term Goal 1 - Progress: Met PT Short Term Goal 2: Pt will gait with LRAD 100' with min A in controlled environment PT Short Term Goal 2 - Progress: Met PT Short Term Goal 3: Pt will negotiate 2 stairs without handrails for home entry with mod A PT Short Term Goal 3 - Progress: Progressing toward goal  Pain Pain Assessment Pain Assessment: No/denies pain Pain Score: 0-No pain Locomotion  Ambulation Ambulation: Yes Ambulation/Gait Assistance: 4: Min assist Ambulation Distance (Feet): 150 Feet Assistive device: Straight cane Ambulation/Gait Assistance Details: Tactile cues for weight shifting;Visual cues/gestures for precautions/safety;Visual cues/gestures for sequencing;Verbal cues for sequencing;Verbal cues for gait pattern;Verbal cues for safe use of DME/AE Ambulation/Gait Assistance Details (indicate cue type and reason): Gait training with taller adjustable SPC through obstacle course to introduce higher level community and household challenges x 3 reps: stepping over low and tall obstacles, weaving L and R, sidestepping and retro stepping around cones, lateral stepping over obstacles, stepping up and down from low step/curb with SPC only, over compliant  surface all with min-mod A and verbal cues for safety and sequence and to maintain more consistent gait speed.  Performed 3 reps of obstacle course.     Other Treatments Treatments Neuromuscular Facilitation: Right;Lower Extremity;Activity to increase sustained activation;Activity to increase lateral weight shifting;Activity to increase anterior-posterior weight shifting during 2 reps tandem heel-toe walking with one UE support and 30 sec hold of tandem stance without UE support and 3 reps R single leg stance with UE support with focus on sustained activation of hip, knee and ankle extensors and stabilization muscles to maintain COG in midline.    Therapy/Group: Individual Therapy  Edman Circle [facility] 07/01/2011, 5:01 PM

## 2011-07-01 NOTE — Progress Notes (Addendum)
Per State Regulation 482.30 This chart was reviewed for medical necessity with respect to the patient's Admission/Duration of stay. Participating with good progress toward goals. Tolerating regular diet. Encouraging fluids, monitoring labs. Family education this week in preparation for d/c Friday. Meryl Dare                 Nurse Care Manager

## 2011-07-02 MED ORDER — METOPROLOL TARTRATE 50 MG PO TABS
50.0000 mg | ORAL_TABLET | Freq: Two times a day (BID) | ORAL | Status: DC
  Administered 2011-07-02 – 2011-07-03 (×2): 50 mg via ORAL
  Filled 2011-07-02 (×4): qty 1

## 2011-07-02 NOTE — Progress Notes (Signed)
Social Work Patient ID: Kimberly Lester, female   DOB: July 04, 1930, 78 y.o.   MRN: [id number]  Spoke with daughter-Kathy plan to come tomorrow at 10:00 to do family education and then once Complete ready for discharge.  Pt has all DME from previous hip replacement last year.  Continue to Work on discharge needs. Team aware of the plan.

## 2011-07-02 NOTE — Progress Notes (Signed)
Occupational Therapy Session Note  Patient Details  Name: Kimberly Lester MRN: [id number] Date of Birth: [birth year]  Today's Date: 07/02/2011 Visit 1: Time: 5621-3086 Time Calculation (min): 57 min Visit 2: 1105- 1130 Time Calculation (min): 25 minutes  Short Term Goals: STGs/ LTGs met  Skilled Therapeutic Interventions/Progress Updates:  Visit 1:  Pt seen for BADL retraining with focus on standing balance, ambulation short distances without AD, and RUE functional use.  Pt did very well using RUE as a dominant assist.  Pt met all goals and is ready for discharge and family education.  RUE strengthening with arm bicycle and theraband.  Provided pt with HEP. Visit 2: RUE strengthening with dowel exercises and FMC with fastening clothespins to line.     Therapy Documentation Precautions:  Precautions Precautions: Fall Precaution Comments: Patient had R THR one year ago; patient states she was still on precautions one year s/p/, will need to verify Required Braces or Orthoses: No Restrictions Weight Bearing Restrictions: No Other Position/Activity Restrictions: Patient had hip replacement 1 year ago and was on total hip precautions, but pt. states that she has not been compliant with precautions.       Pain: Pain Assessment Pain Assessment: No/denies pain       See FIM for current functional status  Therapy/Group: Individual Therapy  Melayah Skorupski 07/02/2011, 11:54 AM

## 2011-07-02 NOTE — Progress Notes (Signed)
Physical Therapy Session Note  Patient Details  Name: Kimberly Lester MRN: [id number] Date of Birth: Nov 28, 1930  Today's Date: 07/02/2011 Time: 1000-1100 and 4696-2952 Time Calculation (min): 60 min and 45 min  Short Term Goals: No short term goals set  Therapy Documentation Precautions:  Precautions Precautions: Fall Precaution Comments: Patient had R THR one year ago; patient states she was still on precautions one year s/p/, will need to verify Required Braces or Orthoses: No Restrictions Weight Bearing Restrictions: No Other Position/Activity Restrictions: Patient had hip replacement 1 year ago and was on total hip precautions, but pt. states that she has not been compliant with precautions. Pain: Pain Assessment Pain Assessment: No/denies pain Locomotion : Ambulation Ambulation: Yes Ambulation/Gait Assistance: 5: Supervision;3: Mod assist Ambulation Distance (Feet): 150 Feet (x 2 with RW and 263' on treadmill) Assistive device: Straight cane Ambulation/Gait Assistance Details: Tactile cues for weight shifting;Tactile cues for placement;Verbal cues for sequencing;Verbal cues for gait pattern Ambulation/Gait Assistance Details (indicate cue type and reason): Gait on level surface with cane and supervision; continued rhythmic gait train on treadmill x 5 min at .6 mph beginning with bilat UE >> one UE support to mimic SPC with manual and verbal cues for increased stance time on RLE, full terminal extension RLE to allow for full step length LLE and mod A for balance.  Patient notes some tightness in R hip flexor.     PM session: Practiced stair sequence with patient for home entry and exit: Multiple reps up and down 2 6 inch steps with no rail but with cane in LUE and min A plus verbal cues for sequence  Exercises:  Supine stretching of RLE groin and hip flexor muscles for increased ROM for gait; supine hip IR activation training and training of hip extensors with 10 reps bridges with  alternating R and L anterior and posterior rotation; standing closed chain strengthening of R hip ABD pushing against weighted object for resistance with bilat UE support and tactile cues to maintain upright trunk.  PM: patient given handout with pictures of standing balance and strengthening HEP; reviewed exercises with patient and had patient give repeat demonstration of each exercise x 10 reps each LE with bilat UE support and min verbal and tactile cues for appropriate muscle activation.  See FIM for current functional status  Therapy/Group: Individual Therapy  Edman Circle [facility] 07/02/2011, 10:58 AM

## 2011-07-02 NOTE — Progress Notes (Signed)
Patient Details  Name: Kimberly Lester MRN: [id number] Date of Birth: August 05, 1930  Today's Date: 07/02/2011 Time: 1430-1500  Skilled Therapeutic Interventions/Progress Updates: Session focused on discharge planning and use of time at home.  Pt able to identify 2-3 activities for participation in at home as well as identifying safety concerns.  Pt excited about discharge tomorrow.  No c/o pain.  Therapy/Group: Individual Therapy  Activity Level: Simple:  Level of assist: Supervision  Julie Paolini 07/02/2011, 5:30 PM

## 2011-07-02 NOTE — Progress Notes (Signed)
Social Work Patient ID: Kimberly Lester, female   DOB: 1930/07/08, 78 y.o.   MRN: [id number]  Met with pt and team who reports pt meeting goals sooner and discharge could be moved up to tomorrow once Family education completed.  Contacted family to schedule family education, awaiting return call.  Pam-PA and MD Aware of possible discharge tomorrow.  Pt very pleased with plan.

## 2011-07-02 NOTE — Progress Notes (Signed)
Patient ID: Kimberly Lester, female   DOB: 12/07/1930, 78 y.o.   MRN: [id number] Subjective/Complaints:  No complaints of SOB, CP,ABD pain. Review of Systems - Negative except insomia Objective: Vital Signs: Blood pressure 158/75, pulse 91, temperature 97.9 F (36.6 C), temperature source Oral, resp. rate 18, height 5\' 2"  (1.575 m), weight 70.2 kg (154 lb 12.2 oz), SpO2 93.00%. No results found. No results found for this basename: WBC:2,HGB:2,HCT:2,PLT:2 in the last 72 hours No results found for this basename: NA:2,K:2,CL:2,CO2:2,GLUCOSE:2,BUN:2,CREATININE:2,CALCIUM:2 in the last 72 hours CBG (last 3)  No results found for this basename: GLUCAP:3 in the last 72 hours  Wt Readings from Last 3 Encounters:  06/27/11 70.2 kg (154 lb 12.2 oz)  06/19/11 68.493 kg (151 lb)    Physical Exam:  General appearance: alert and no distress Head: Normocephalic, without obvious abnormality, atraumatic Eyes: negative, conjunctivae/corneas clear. PERRL, EOM's intact. Nose: Nares normal. Septum midline. Mucosa normal. No drainage or sinus tenderness. Throat: lips, mucosa, and tongue normal; Full set dentures in place. Neck: supple, no masses Resp: clear to auscultation bilaterally Cardio: regular rate and rhythm GI: soft, non-tender; bowel sounds normal; no masses,  no organomegaly Extremities: extremities normal, atraumatic, no cyanosis or edema Pulses: 2+ and symmetric Skin: Skin color, texture, turgor normal. Mild heat rash getting better. Neurologic: Mental status: Alert, oriented, thought content appropriateRUE weakness with ataxia.  Motor RUE 4-/5 bi,tri,grip RLE 4-/5 hip flex, knee flex, ankle DF/PF  Assessment/Plan: 1. Functional deficits secondary to left pontine infarct  which require 3+ hours per day of interdisciplinary therapy in a comprehensive inpatient rehab setting. Physiatrist is providing close team supervision and 24 hour management of active medical problems listed  below. Physiatrist and rehab team continue to assess barriers to discharge/monitor patient progress toward functional and medical goals. FIM: FIM - Bathing Bathing Steps Patient Completed: Chest;Right Arm;Left Arm;Abdomen;Front perineal area;Buttocks;Right upper leg;Left upper leg;Right lower leg (including foot);Left lower leg (including foot) Bathing: 6: More than reasonable amount of time  FIM - Upper Body Dressing/Undressing Upper body dressing/undressing steps patient completed: Thread/unthread right bra strap;Thread/unthread left bra strap;Thread/unthread right sleeve of pullover shirt/dresss;Thread/unthread left sleeve of pullover shirt/dress;Put head through opening of pull over shirt/dress;Pull shirt over trunk Upper body dressing/undressing: 4: Min-Patient completed 75 plus % of tasks FIM - Lower Body Dressing/Undressing Lower body dressing/undressing steps patient completed: Thread/unthread right underwear leg;Thread/unthread left underwear leg;Pull underwear up/down;Pull pants up/down;Thread/unthread left pants leg;Thread/unthread right pants leg Lower body dressing/undressing: 4: Min-Patient completed 75 plus % of tasks  FIM - Toileting Toileting steps completed by patient: Adjust clothing prior to toileting;Performs perineal hygiene;Adjust clothing after toileting Toileting Assistive Devices: Grab bar or rail for support Toileting: 5: Supervision: Safety issues/verbal cues  FIM - Diplomatic Services operational officer Devices: Grab bars Toilet Transfers: 5-To toilet/BSC: Supervision (verbal cues/safety issues)  FIM - Banker Devices: Teacher, music: 5: Supine > Sit: Supervision (verbal cues/safety issues);5: Sit > Supine: Supervision (verbal cues/safety issues);5: Bed > Chair or W/C: Supervision (verbal cues/safety issues);5: Chair or W/C > Bed: Supervision (verbal cues/safety issues)  FIM - Locomotion:  Wheelchair Distance: 150 Locomotion: Wheelchair: 0: Activity did not occur FIM - Locomotion: Ambulation Locomotion: Ambulation Assistive Devices: Emergency planning/management officer Ambulation/Gait Assistance: 4: Min assist Locomotion: Ambulation: 4: Travels 150 ft or more with minimal assistance (Pt.>75%)  Comprehension Comprehension Mode: Auditory Comprehension: 7-Follows complex conversation/direction: With no assist  Expression Expression Mode: Verbal Expression Assistive Devices: 6-Other (Comment) Expression: 6-Expresses complex ideas: With extra time/assistive device  Social Interaction Social Interaction Mode: Asleep Social Interaction: 7-Interacts appropriately with others - No medications needed.  Problem Solving Problem Solving Mode: Not assessed Problem Solving: 7-Solves complex problems: Recognizes & self-corrects  Memory Memory Mode: Asleep Memory Assistive Devices: Other (Comment) Memory: 6-More than reasonable amt of time  Medical Problem List and Plan:   1. DVT Prophylaxis/Anticoagulation: Pharmaceutical: Lovenox   2. Pain Management: N/A. Used aleve for arthritic pain. Will use tylenol prn. Some complaints of right knee pain/instability this am.  Will add Voltaren gel.  3. Mood:  Some anxiety noted about deficits and concerns about recovery.  Ego support provided.  Continue ot monitor with ego support by team.   4. HTN: Monitor with bid checks. Blood pressure still with occ systolic elevation. HR still elevated increase metoprolol  5. Dyslipidemia: continue crestor.  LFTs normal.   6. RLS: Increased reqip helped 7. GERD: Continue PPI.     LOS (Days) 13 A FACE TO FACE EVALUATION WAS PERFORMED  Kimberly Lester E 07/02/2011, 8:34 AM

## 2011-07-03 MED ORDER — ROPINIROLE HCL 0.5 MG PO TABS: 0.5000 mg | ORAL_TABLET | Freq: Every day | ORAL | Status: DC

## 2011-07-03 MED ORDER — LISINOPRIL 20 MG PO TABS
20.0000 mg | ORAL_TABLET | Freq: Every day | ORAL | Status: DC
  Administered 2011-07-03: 20 mg via ORAL
  Filled 2011-07-03 (×2): qty 1

## 2011-07-03 MED ORDER — SENNA 8.6 MG PO TABS: 2.0000 | ORAL_TABLET | Freq: Every day | ORAL | Status: DC

## 2011-07-03 MED ORDER — SIMVASTATIN 40 MG PO TABS: 40.0000 mg | ORAL_TABLET | Freq: Every evening | ORAL | Status: DC

## 2011-07-03 MED ORDER — CLOPIDOGREL BISULFATE 75 MG PO TABS: 75.0000 mg | ORAL_TABLET | Freq: Every day | ORAL | Status: AC

## 2011-07-03 MED ORDER — HYDROCHLOROTHIAZIDE 12.5 MG PO CAPS: 12.5000 mg | ORAL_CAPSULE | Freq: Every day | ORAL | Status: DC

## 2011-07-03 MED ORDER — LISINOPRIL 10 MG PO TABS: 10.0000 mg | ORAL_TABLET | Freq: Every day | ORAL | Status: DC

## 2011-07-03 MED ORDER — METOPROLOL SUCCINATE ER 50 MG PO TB24: 50.0000 mg | ORAL_TABLET | Freq: Two times a day (BID) | ORAL | Status: AC

## 2011-07-03 MED ORDER — LISINOPRIL 10 MG PO TABS
10.0000 mg | ORAL_TABLET | Freq: Every day | ORAL | Status: DC
Start: 2011-07-03 — End: 2011-07-03
  Filled 2011-07-03: qty 1

## 2011-07-03 MED ORDER — LISINOPRIL 20 MG PO TABS: ORAL_TABLET | ORAL | Status: DC

## 2011-07-03 NOTE — Progress Notes (Signed)
Occupational Therapy Session Note  Patient Details  Name: Bitania Shankland MRN: [id number] Date of Birth: 1931/02/01  Today's Date: 07/03/2011 Time: 4098-1191 Time Calculation (min): 40 min    Skilled Therapeutic Interventions/Progress Updates: Patient seen this am for bathing and dressing at shower level.  Patient ambulating in room with straight cane, attempting brief periods without cane.  Patient required no physical assistance during functional mobility in room.  Patient able to hook bra independently using bilateral upper extremities!  Patient very excited for discharge home today.  Patient leaving at a modified independent level with the exception of donning socks, which she rarely wore at home, and husband has assisted with this since hip surgery 1 year ago.     Therapy Documentation Precautions:  Precautions Precautions: Fall Precaution Comments: Patient had R THR one year ago; patient states she was still on precautions one year s/p/, will need to verify Required Braces or Orthoses: No Restrictions Weight Bearing Restrictions: No Other Position/Activity Restrictions: Patient had hip replacement 1 year ago and was on total hip precautions, but pt. states that she has not been compliant with precautions.    Pain:  No report of pain    See FIM for current functional status  Therapy/Group: Individual Therapy  Collier Salina 07/03/2011, 9:31 AM

## 2011-07-03 NOTE — Progress Notes (Addendum)
Physical Therapy Discharge Summary  Patient Details  Name: Kimberly Lester MRN: [id number] Date of Birth: [birth year] Today's Date: 07/03/2011 Treatment time: 1120-1200 (40 min)  Patient has made excellent progress towards PT LTG and has met 6 of 6 long term goals due to improved balance, increased strength, ability to compensate for deficits, functional use of  right upper extremity and right lower extremity and improved coordination.  Secondary to patient's significant progress patient to discharge 2 days early at an ambulatory level Modified Independent.   Patient's care partner is independent to provide the necessary stair negotiation assistance at discharge.  Reasons goals not met: All goals met and exceeded  Recommendation:  Patient will benefit from ongoing skilled PT services in home health setting to continue to advance safe functional mobility, address ongoing impairments in RUE and LE weakness and motor control/coordination, dynamic gait and balance, and minimize fall risk and return to previous level of function.  Patient still at falls risk as indicated by Berg score of 49/56.    Equipment: No equipment provided  Reasons for discharge: treatment goals met and discharge from hospital  Patient/family agrees with progress made and goals achieved: Yes  PT Discharge Precautions/Restrictions Restrictions Weight Bearing Restrictions: No Pain Pain Assessment Pain Assessment: No/denies pain Pain Score: 0-No pain  Cognition Overall Cognitive Status: Appears within functional limits for tasks assessed  Sensation Sensation Light Touch: Appears Intact  Mobility Transfers Patient mod I for bed mobility and bed to chair transfers with [facility]; reviewed with family car transfer and patient gave repeat demonstration with [facility] and supervision placing cane in car first, sitting onto seat and then swinging LE into car.  Reviewed with patient and family floor to furniture transfer sequence and  safety considerations if patient does experience a fall at home (warning signs of another CVA, fx, avoiding lifting patient by shoulder secondary to risk for subluxation); patient gave repeat demonstration of floor transfer with supervision and verbal cues for sequence.   Locomotion    Patient is mod I for gait in home and controlled environments with SPC x 150' Reviewed safe stair sequence with patient and family with [facility]; patient gave repeat demonstration x 2 with SPC and min A up and down 2 stairs x 3 reps.   Balance Standardized Balance Assessment Standardized Balance Assessment: Berg Balance Test Berg Balance Test Sit to Stand: Able to stand without using hands and stabilize independently Standing Unsupported: Able to stand safely 2 minutes Sitting with Back Unsupported but Feet Supported on Floor or Stool: Able to sit safely and securely 2 minutes Stand to Sit: Sits safely with minimal use of hands Transfers: Able to transfer safely, minor use of hands Standing Unsupported with Eyes Closed: Able to stand 10 seconds safely Standing Ubsupported with Feet Together: Able to place feet together independently and stand 1 minute safely From Standing, Reach Forward with Outstretched Arm: Can reach confidently >25 cm (10") From Standing Position, Pick up Object from Floor: Able to pick up shoe safely and easily From Standing Position, Turn to Look Behind Over each Shoulder: Looks behind from both sides and weight shifts well Turn 360 Degrees: Able to turn 360 degrees safely but slowly Standing Unsupported, Alternately Place Feet on Step/Stool: Able to stand independently and complete 8 steps >20 seconds Standing Unsupported, One Foot in Front: Able to take small step independently and hold 30 seconds Standing on One Leg: Able to lift leg independently and hold equal to or more than 3 seconds Total Score: 49  indicating moderate risk for falls (50%)   Extremity Assessment  RLE Assessment RLE  Assessment: Exceptions to [facility] RLE Strength RLE Overall Strength: Deficits (improved to 4/5 overall) LLE Assessment LLE Assessment: Within Functional Limits  See FIM for current functional status  Edman Circle [facility] 07/03/2011, 12:15 PM

## 2011-07-03 NOTE — Progress Notes (Signed)
Patient ID: Kimberly Lester, female   DOB: 1931/02/07, 78 y.o.   MRN: [id number] Subjective/Complaints:  No complaints of SOB, CP,ABD pain. Review of Systems - Negative except insomia Objective: Vital Signs: Blood pressure 156/81, pulse 67, temperature 98.3 F (36.8 C), temperature source Oral, resp. rate 18, height 5\' 2"  (1.575 m), weight 70.2 kg (154 lb 12.2 oz), SpO2 92.00%. No results found. No results found for this basename: WBC:2,HGB:2,HCT:2,PLT:2 in the last 72 hours No results found for this basename: NA:2,K:2,CL:2,CO2:2,GLUCOSE:2,BUN:2,CREATININE:2,CALCIUM:2 in the last 72 hours CBG (last 3)  No results found for this basename: GLUCAP:3 in the last 72 hours  Wt Readings from Last 3 Encounters:  06/27/11 70.2 kg (154 lb 12.2 oz)  06/19/11 68.493 kg (151 lb)    Physical Exam:  General appearance: alert and no distress Head: Normocephalic, without obvious abnormality, atraumatic Eyes: negative, conjunctivae/corneas clear. PERRL, EOM's intact. Nose: Nares normal. Septum midline. Mucosa normal. No drainage or sinus tenderness. Throat: lips, mucosa, and tongue normal; Full set dentures in place. Neck: supple, no masses Resp: clear to auscultation bilaterally Cardio: regular rate and rhythm GI: soft, non-tender; bowel sounds normal; no masses,  no organomegaly Extremities: extremities normal, atraumatic, no cyanosis or edema Pulses: 2+ and symmetric Skin: Skin color, texture, turgor normal. Mild heat rash getting better. Neurologic: Mental status: Alert, oriented, thought content appropriateRUE weakness with ataxia.  Motor RUE 4-/5 bi,tri,grip RLE 4-/5 hip flex, knee flex, ankle DF/PF  Assessment/Plan: 1. Functional deficits secondary to left pontine infarct stable for D/C      FIM: FIM - Bathing Bathing Steps Patient Completed: Chest;Right Arm;Left Arm;Abdomen;Front perineal area;Buttocks;Right upper leg;Left lower leg (including foot);Right lower leg (including foot);Left upper  leg Bathing: 6: Assistive device (Comment) (seated at times on tub bench)  FIM - Upper Body Dressing/Undressing Upper body dressing/undressing steps patient completed: Thread/unthread right bra strap;Thread/unthread left bra strap;Hook/unhook bra;Thread/unthread right sleeve of pullover shirt/dresss;Thread/unthread left sleeve of pullover shirt/dress;Put head through opening of pull over shirt/dress Upper body dressing/undressing: 7: Complete Independence: No helper FIM - Lower Body Dressing/Undressing Lower body dressing/undressing steps patient completed: Thread/unthread right underwear leg;Thread/unthread left underwear leg;Pull underwear up/down;Thread/unthread right pants leg;Thread/unthread left pants leg;Pull pants up/down;Fasten/unfasten pants;Don/Doff right shoe;Don/Doff left shoe Lower body dressing/undressing: 4: Min-Patient completed 75 plus % of tasks (assist with socks, as prior to stroke)  FIM - Toileting Toileting steps completed by patient: Adjust clothing prior to toileting;Adjust clothing after toileting;Performs perineal hygiene Toileting Assistive Devices: Grab bar or rail for support Toileting: 7: Independent: No helper, no device  FIM - Diplomatic Services operational officer Devices: Occupational hygienist Transfers: 6-Assistive device: No helper  FIM - Banker Devices: Teacher, music: 6: Assistive device: no helper  FIM - Locomotion: Wheelchair Distance: 150 Locomotion: Wheelchair: 0: Activity did not occur FIM - Locomotion: Ambulation Locomotion: Ambulation Assistive Devices: Emergency planning/management officer Ambulation/Gait Assistance: 5: Supervision;3: Mod assist Locomotion: Ambulation: 2: Travels 50 - 149 ft with minimal assistance (Pt.>75%)  Comprehension Comprehension Mode: Auditory Comprehension: 7-Follows complex conversation/direction: With no assist  Expression Expression Mode: Verbal Expression Assistive Devices: 6-Other  (Comment) Expression: 7-Expresses complex ideas: With no assist  Social Interaction Social Interaction Mode: Asleep Social Interaction: 7-Interacts appropriately with others - No medications needed.  Problem Solving Problem Solving Mode: Not assessed Problem Solving: 7-Solves complex problems: Recognizes & self-corrects  Memory Memory Mode: Asleep Memory Assistive Devices: Other (Comment) Memory: 6-Assistive device: No helper  Medical Problem List and Plan:   1. DVT Prophylaxis/Anticoagulation: Pharmaceutical: Lovenox  2. Pain Management: N/A. Used aleve for arthritic pain. Will use tylenol prn. Some complaints of right knee pain/instability this am.  Will add Voltaren gel.  3. Mood:  Some anxiety noted about deficits and concerns about recovery.  Ego support provided.  Continue ot monitor with ego support by team.   4. HTN: Monitor with bid checks. Blood pressure still with occ systolic elevation. HR still elevated increase metoprolol HR 67 f/u with primary MD  5. Dyslipidemia: continue crestor.  LFTs normal.   6. RLS: Increased requip helped 7. GERD: Continue PPI.     LOS (Days) 14 A FACE TO FACE EVALUATION WAS PERFORMED  Maisha Bogen E 07/03/2011, 10:03 AM

## 2011-07-03 NOTE — Progress Notes (Signed)
Patient discharged at 1320 to home accompanied by family. [name], PA provided discharge instructions to patient and family, understanding verbalized. Kimberly Lester

## 2011-07-03 NOTE — Progress Notes (Signed)
Speech Language Pathology Daily Session Note & Discharge Summary  Patient Details  Name: Kimberly Lester MRN: [id number] Date of Birth: 10/20/1930  Today's Date: 07/03/2011 Time: 0945-1000 Time Calculation (min): 15 min  Short Term Goals: Week 2: SLP Short Term Goal 1 (Week 2): Patient will consume regular textures and thin liquids with modified independent use of compensaory strategies and no overt s/s of aspiration SLP Short Term Goal 2 (Week 2): Patient will perform pharyngeal strenghting exercises with modified independence  SLP Short Term Goal 3 (Week 2): Patient will participate in trials of thin liquids via staw and medicaition with thin liquid with supervision verbal cues  Skilled Therapeutic Interventions: Patient independent with all swallowing; self managed techniques as needed.  Patient consumed thin liquids via straw with no overt s./s of aspiration.    Daily Session Precautions/Restrictions    FIM:  Comprehension Comprehension Mode: Auditory Comprehension: 7-Follows complex conversation/direction: With no assist Expression Expression Mode: Verbal Expression: 7-Expresses complex ideas: With no assist Social Interaction Social Interaction: 7-Interacts appropriately with others - No medications needed. Problem Solving Problem Solving: 7-Solves complex problems: Recognizes & self-corrects Memory Memory: 6-Assistive device: No helper FIM - Eating Eating Activity: 7: Complete independence:no helper General    Pain Pain Assessment Pain Assessment: No/denies pain Pain Score: 0-No pain  Therapy/Group: Individual Therapy Speech Language Pathology Discharge Summary  Patient Details  Name: Kimberly Lester MRN: [id number] Date of Birth: Apr 30, 1931 Today's Date: 07/03/2011  Patient has met 3 of 3 long term goals due to functional gains in swallow function.  Patient to discharge at overall Independent level.  Patient's care partner required to provide no assistance at  discharge.  Reasons goals not met: n/a  Recommendation:  Patient requires no SLP follow up post CIR discharge.  Equipment: none  Reasons for discharge: treatment goals met and discharge from hospital  Patient/family agrees with progress made and goals achieved: Yes  See FIM for current functional status  Charlane Ferretti., CCC-SLP [phone]  Jeannetta Cerutti 07/03/2011, 9:52 AM

## 2011-07-03 NOTE — Progress Notes (Signed)
Occupational Therapy Discharge Summary and Skilled Therapy Intervention  Patient Details  Name: Kimberly Lester MRN: [id number] Date of Birth: [birth year] Today's Date: 07/03/2011  Time: 1100-1120 Time Calculation: 20 minutes Pt seen for family education with pt's daughter and husband to review her progress and discuss safety with shower transfers and kitchen activities.  Reviewed HEP.  Patient has met 11 of 11 long term goals due to improved activity tolerance, improved balance, postural control, functional use of  RIGHT upper extremity and improved coordination.  Patient to discharge at overall Modified Independent level.  Patient's care partner is independent to provide the necessary physical assistance at discharge.     Recommendation:  Patient will benefit from ongoing skilled OT services in home health setting to continue to advance functional skills in the area of iADL.  Equipment: No equipment provided  Reasons for discharge: treatment goals met  Patient/family agrees with progress made and goals achieved: Yes  OT Discharge Precautions/Restrictions  Restrictions Weight Bearing Restrictions: No    Pain Pain Assessment Pain Assessment: No/denies pain Pain Score: 0-No pain ADL ADL Eating: Independent Where Assessed-Eating: Chair Grooming: Independent Where Assessed-Grooming: Standing at sink Upper Body Bathing: Independent Where Assessed-Upper Body Bathing: Shower Lower Body Bathing: Modified independent Where Assessed-Lower Body Bathing: Shower Upper Body Dressing: Modified independent (Device) Where Assessed-Upper Body Dressing: Chair Lower Body Dressing: Minimal assistance Where Assessed-Lower Body Dressing: Chair Toileting: Modified independent Where Assessed-Toileting: Teacher, adult education: Engineer, agricultural Method: Event organiser: Close supervision Film/video editor Method: Designer, industrial/product:  Information systems manager with back;Grab bars Vision/Perception  Vision - History Baseline Vision: No visual deficits Vision - Naval architect: Within Chemical engineer Perception: Within Functional Limits Praxis Praxis: Intact  Cognition Overall Cognitive Status: Appears within functional limits for tasks assessed Sensation Sensation Light Touch: Appears Intact Stereognosis: Appears Intact Hot/Cold: Appears Intact Proprioception: Appears Intact Coordination Gross Motor Movements are Fluid and Coordinated: Yes Fine Motor Movements are Fluid and Coordinated: Yes Motor  Motor Motor: Within Functional Limits Mobility  Transfers Sit to Stand: 6: Modified independent (Device/Increase time)  Trunk/Postural Assessment  Cervical Assessment Cervical Assessment: Within Functional Limits Thoracic Assessment Thoracic Assessment: Within Functional Limits Lumbar Assessment Lumbar Assessment: Within Functional Limits Postural Control Postural Control: Within Functional Limits  Balance Dynamic Sitting Balance Dynamic Sitting - Level of Assistance: 6: Modified independent (Device/Increase time) Static Standing Balance Static Standing - Level of Assistance: 6: Modified independent (Device/Increase time) Dynamic Standing Balance Dynamic Standing - Level of Assistance: 6: Modified independent (Device/Increase time) Extremity/Trunk Assessment RUE Assessment RUE Assessment: Exceptions to [facility] RUE Strength RUE Overall Strength: Within Functional Limits for tasks performed;Deficits (overall strength 3+ to 4-/5) LUE Assessment LUE Assessment: Within Functional Limits  See FIM for current functional status  Ruddy Swire 07/03/2011, 12:15 PM

## 2011-07-03 NOTE — Progress Notes (Signed)
Therapeutic Recreation Discharge Summary Patient Details  Name: Kimberly Lester MRN: [id number] Date of Birth: Jan 22, 1931  Long term goals set: 1  Long term goals met: 1  Comments on progress toward goals: Pt has made excellent progress toward goals and is motivated to regain further independence.  Pt at supervision level for TR tasks, needing set-up assist.  Pt discharged home today with family at overall Mod I level.   Reasons goals not met: n/a  Equipment acquired: n/a  Reasons for discharge: discharge from hospital   Patient/family agrees with progress made and goals achieved: Yes  [name] 07/03/2011, 5:48 PM

## 2011-07-03 NOTE — Progress Notes (Signed)
Social Work Discharge Note Discharge Note  The overall goal for the admission was met for:   Discharge location: Yes-HOME WITH HUSBAND AND DAUGHTER'S TO ASSIST  Length of Stay: Yes-14 DAYS  Discharge activity level: Yes-MOD/I LEVEL  Home/community participation: Yes  Services provided included: MD, RD, PT, OT, SLP, RN, CM, TR, Pharmacy and SW  Financial Services: Private Insurance: [facility]  Follow-up services arranged: Home Health: Judie Grieve, OT and Patient/Family request agency HH: REQUESTS AGENCY USED BEFORE, DME: NO NEEDS  Comments (or additional information):  Patient/Family verbalized understanding of follow-up arrangements: Yes  Individual responsible for coordination of the follow-up plan: PT AND KATHY-DAUGHTER  Confirmed correct DME delivered: Hortencia, Martire 07/03/2011    Katyra Tomassetti, Lemar Livings

## 2011-07-06 NOTE — Discharge Summary (Signed)
NAME:  Kimberly Lester, Kimberly Lester                 ACCOUNT NO.:  [id number]  MEDICAL RECORD NO.:  [id number]  LOCATION:  4147                         FACILITY:  [facility]  PHYSICIAN:  Erick Colace, M.D.DATE OF BIRTH:  01/16/1931  DATE OF ADMISSION:  06/19/2011 DATE OF DISCHARGE:  07/03/2011                              DISCHARGE SUMMARY   DISCHARGE DIAGNOSES: 1. Left pontine cerebrovascular accident. 2. Hypertension. 3. Dyslipidemia. 4. Restless legs syndrome. 5. Insomnia, much improved.  HISTORY OF PRESENT ILLNESS:  Ms. Kimberly Lester is a 78 year old, right- handed female with history of hypertension, dyslipidemia, admitted to [facility] on June 16, 2011, with slurred speech and right- sided weakness and difficulty walking.  MRI of brain done revealing acute non-hemorrhagic left pontine infarct.  Carotid Dopplers done showed bilateral carotid bifurcation and proximal ICA plaque with less than 50% stenosis.  The patient was placed on Plavix for CVA prophylaxis.  Currently, the patient continues with slurred speech as well as right-sided weakness upper greater than lower extremity.  Blood pressure continues to be labile.  Rehab was consulted for progressive therapies.  REVIEW OF SYSTEMS:  Positive for constipation as well as myalgias. Also, positive for speech changes with focal weakness right side, insomnia and restless legs impacting sleep.  Negative for hearing loss, visual changes, shortness of breath, chest pain, negative for urgency or frequency.  PAST MEDICAL HISTORY:  Positive for OA, restless legs syndrome, hypertension, GERD and hypercholesterolemia.  PAST SURGICAL HISTORY:  Right total hip replacement and C-section.  FAMILY HISTORY:  Tuberculosis in mother and COPD and coronary artery disease in father.  SOCIAL HISTORY:  The patient is married, was independent and active prior to admission.  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  The patient still works in a  yard.  She does not smoke, use alcohol or illicit drugs.  ALLERGIES:  ATIVAN and PERCOCET.  Home is 1 level with 2 steps at entry.  FUNCTIONAL HISTORY:  The patient was independent without assisted device prior to admission.  She still drives and does her home management.  FUNCTIONAL STATUS:  The patient is min to mod assist for transfers, has tendency to lean to the right and difficulty advancing right lower extremity.  Min to mod assist for standing, balance, contact guard to min assist for ambulating 40 feet x2 with the rest breaks and unsteady gait.  ADLs, the patient requiring increased time to utilize right upper extremity and impaired fine motor coordination.  PHYSICAL EXAMINATION:  VITAL SIGNS:  Blood pressure 153/83, pulse 80, temperature 98.6 and respiratory rate 22. GENERAL:  The patient is well-nourished, well-developed female, alert and oriented x3. HEENT:  Atraumatic, normocephalic.  Pupils equal, round and reactive to light.  Oral mucosa is pink and moist. NECK:  Supple with normal range of motion. CARDIOVASCULAR:  Normal rate, regular rhythm. PULMONARY:  Normal respiratory effort.  Breath sounds normal. GI:  Abdomen is soft, positive bowel sounds. MUSCULOSKELETAL:  The patient with normal range of motion and arthritic changes, bilateral hands. NEUROLOGIC:  The patient is alert and oriented x3.  Follows commands without difficulty.  Speech with some hesitation and mild slurring occasionally.  Right upper extremity  strength is 2+ to 3-/5 proximally, 3+/5 distally.  Right lower extremity is 2+ proximally, 3+ to 4/5 distally.  Left lower extremity strength is grossly 5/5.  Left upper extremity is 5/5.  No gross sensory deficits.  Reflexes are 1+.  She might have mild right central 7, although this is minimal and she is appropriate with good insight and awareness.  Follows all simple commands without difficulty.  Memory is good.  HOSPITAL COURSE:  Mr. Deveney Bayon was  admitted to rehab on June 19, 2011, for inpatient therapies to consist of PT, OT and speech therapy at least 3 hours 5 days a week.  Past admission, physiatrist, rehab, RN, and therapy team have worked together to provide customized collaborative interdisciplinary care.  Rehab RN has worked with the patient on bowel and bladder program as well as safety plan.  The patient's blood pressures were monitored on b.i.d. basis during this stay.  Meds were titrated for better control.  At the time of discharge, blood pressure is ranging from 140s-150s systolics, 70s to 150s diastolic.  Labs were done past admission and the patient's hydration status was monitored as the patient was initially on nectar liquids. Speech therapy has followed along for dysphagia treatment and the patient was advanced to thin liquids.  At time of admission, she was noted to have renal insufficiency with BUN at 24, creatinine at 0.61. With advancement of diet as well as pushing of p.o. fluids, the patient's renal status has improved.  LABS:  From February 8, revealed sodium 138, potassium 3.6, chloride 101, CO2 27, BUN 17, creatinine 0.69, glucose 107.  Check of CBC at admission revealed H and H at 12.5 and 36.9.  White count 6.4, platelets 160.  A UA/UC was done past admission.  UA was cloudy.  Urine culture showed 15,000 colonies of multi species and the patient has had no symptoms.  The patient's p.o. intake has been good.  She was noted to have complaints of insomnia with restless legs symptoms.  She was started on Requip which was increased to 0.5 mg at bedtime, additionally trazodone was added with good relief.  During the patient's stay in rehab, weekly team conferences were held to monitor the patient's progress, set goals, as well as discuss barriers to discharge.  OT has worked with the patient on activity tolerance, balance postural control as well as functional use of right upper extremity.  By the  time of discharge, the patient is showing improvement in right upper extremity use with improved coordination.  She is at modified independent level for all ADL tasks.  She does require assistance with donning socks, which she rarely wears at home and husband has assisted with this, since her hip surgery a year ago. Physical therapy has worked with the patient on overall mobility and strengthening.  At the time of discharge, the patient was modified independent for transfers, modified independent for ambulating up to 150 feet with straight cane.  Due to her excellent progress, she was discharged 2 days early.  Speech therapy has worked with the patient on dysphagia treatment.  The patient has been educated on pharyngeal strengthening exercises and she is able to do this independently.  Her comprehension and expression for high-level tasks is intact.  No further speech therapy needed past discharge.  On July 03, 2011, the patient is discharged to home.  DISCHARGE MEDICATIONS: 1. Hydrochlorothiazide 12.5 mg p.o. per day. 2. Lisinopril 20 mg per day. 3. Requip 0.5 mg at bedtime. 4.  Metoprolol 50 mg b.i.d. 5. Senna 2 p.o. at bedtime p.r.n. 6. Plavix 75 mg p.o. per day. 7. Flaxseed oil daily as needed. 8. Omeprazole 40 mg a day. 9. Zocor 40 mg q.p.m.  DIET INSTRUCTION:  Heart healthy.  ACTIVITY LEVEL:  At intermittent supervision with use of cane.  SPECIAL INSTRUCTIONS:  No driving until cleared by Dr. Wynn Banker. [facility] to provide PT and OT.  Do not use Prinzide or Naprosyn for now.  FOLLOWUP:  The patient to follow up with Dr.  Wynn Banker on July 23, 2011, at 1 o'clock for 1:30 appointment.  Follow up with Dr. Belva Crome in 2 weeks.     Delle Reining, P.A.   ______________________________ Erick Colace, M.D.    PL/MEDQ  D:  07/05/2011  T:  07/06/2011  Job:  [id number]  cc:   Alinda Deem, MD

## 2011-07-20 DIAGNOSIS — G459 Transient cerebral ischemic attack, unspecified: Secondary | ICD-10-CM

## 2011-07-20 HISTORY — DX: Transient cerebral ischemic attack, unspecified: G45.9

## 2011-07-23 ENCOUNTER — Encounter: Payer: Medicare Other | Attending: Physical Medicine & Rehabilitation

## 2011-07-23 ENCOUNTER — Ambulatory Visit (HOSPITAL_BASED_OUTPATIENT_CLINIC_OR_DEPARTMENT_OTHER): Payer: Medicare Other | Admitting: Physical Medicine & Rehabilitation

## 2011-07-23 ENCOUNTER — Encounter: Payer: Self-pay | Admitting: Physical Medicine & Rehabilitation

## 2011-07-23 ENCOUNTER — Inpatient Hospital Stay: Payer: Medicare Other | Admitting: Physical Medicine & Rehabilitation

## 2011-07-23 DIAGNOSIS — I69993 Ataxia following unspecified cerebrovascular disease: Secondary | ICD-10-CM

## 2011-07-23 DIAGNOSIS — R279 Unspecified lack of coordination: Secondary | ICD-10-CM | POA: Insufficient documentation

## 2011-07-23 DIAGNOSIS — I69998 Other sequelae following unspecified cerebrovascular disease: Secondary | ICD-10-CM | POA: Insufficient documentation

## 2011-07-23 DIAGNOSIS — M19049 Primary osteoarthritis, unspecified hand: Secondary | ICD-10-CM | POA: Insufficient documentation

## 2011-07-23 DIAGNOSIS — G459 Transient cerebral ischemic attack, unspecified: Secondary | ICD-10-CM

## 2011-07-23 MED ORDER — DICLOFENAC SODIUM 1 % TD GEL: 1.0000 "application " | Freq: Four times a day (QID) | TRANSDERMAL | Status: DC

## 2011-07-23 NOTE — Patient Instructions (Signed)
Stroke Prevention Some medical conditions and behaviors are associated with an increased chance of having a stroke. You may prevent a stroke by making healthy choices and managing medical conditions. Reduce your risk of having a stroke by:  Staying physically active. Get at least 30 minutes of activity on most or all days.   Not smoking. It may also be helpful to avoid exposure to secondhand smoke.   Limiting alcohol use. Moderate alcohol use is considered to be:   No more than 2 drinks per day for men.   No more than 1 drink per day for nonpregnant women.   Eating healthy foods.   Include 5 or more servings of fruits and vegetables a day.   Certain diets may be prescribed to address high blood pressure, high cholesterol, diabetes, or obesity.   Managing your cholesterol levels.   A low-saturated fat, low-trans fat, low-cholesterol, and high-fiber diet may control cholesterol levels.   Take any prescribed medicines to control cholesterol as directed by your caregiver.   Managing your diabetes.   A controlled-carbohydrate, controlled-sugar diet is recommended to manage diabetes.   Take any prescribed medicines to control diabetes as directed by your caregiver.   Controlling your high blood pressure (hypertension).   A low-salt (sodium), low-saturated fat, low-trans fat, and low-cholesterol diet is recommended to manage high blood pressure.   Take any prescribed medicines to control hypertension as directed by your caregiver.   Maintaining a healthy weight.   A reduced-calorie, low-sodium, low-saturated fat, low-trans fat, low-cholesterol diet is recommended to manage weight.   Stopping drug abuse.   Avoiding birth control pills.   Talk to your caregiver about the risks of taking birth control pills if you are over 35 years old, smoke, get migraines, or have ever had a blood clot.   Getting evaluated for sleep disorders (sleep apnea).   Talk to your caregiver about  getting a sleep evaluation if you snore a lot or have excessive sleepiness.   Taking medicines as directed by your caregiver.   For some people, aspirin or blood thinners (anticoagulants) are helpful in reducing the risk of forming abnormal blood clots that can lead to stroke. If you have the irregular heart rhythm of atrial fibrillation, you should be on a blood thinner unless there is a good reason you cannot take them.   Understand all your medicine instructions.  SEEK IMMEDIATE MEDICAL CARE IF:   You have sudden weakness or numbness of the face, arm, or leg, especially on one side of the body.   You have sudden confusion.   You have trouble speaking (aphasia) or understanding.   You have sudden trouble seeing in one or both eyes.   You have sudden trouble walking.   You have dizziness.   You have a loss of balance or coordination.   You have a sudden, severe headache with no known cause.   You have new chest pain or an irregular heartbeat.  Any of these symptoms may represent a serious problem that is an emergency. Do not wait to see if the symptoms will go away. Get medical help right away. Call your local emergency services (911 in [place] Do not drive yourself to the hospital. Document Released: 06/13/2004 Document Revised: 04/25/2011 Document Reviewed: 12/24/2010 [employer] Patient Information 2012 [employer]. 

## 2011-07-23 NOTE — Progress Notes (Signed)
  Subjective:    Patient ID: Kimberly Lester, female    DOB: Aug 16, 1930, 78 y.o.   MRN: [id number]  HPI HISTORY OF PRESENT ILLNESS: Kimberly Lester is a 78 year old, right-  handed female with history of hypertension, dyslipidemia, admitted to  [facility] on June 16, 2011, with slurred speech and right-  sided weakness and difficulty walking. MRI of brain done revealing  acute non-hemorrhagic left pontine infarct. Carotid Dopplers done  showed bilateral carotid bifurcation and proximal ICA plaque with less  than 50% stenosis. The patient was placed on Plavix for CVA  Prophylaxis Home health therapy is finishing up this week. Receiving PT only Went to the ER at [facility] for a TIA on 07/20/11  Pain Inventory Average Pain 0 Pain Right Now 0 My pain is n/a  In the last 24 hours, has pain interfered with the following? General activity 0 Relation with others 0 Enjoyment of life 0 What TIME of day is your pain at its worst? n/a Sleep (in general) Fair  Pain is worse with: n/a Pain improves with: n/a Relief from Meds: n/a  Mobility walk without assistance how many minutes can you walk? 10-15 ability to climb steps?  yes do you drive?  yes transfers alone Do you have any goals in this area?  yes  Function retired  Neuro/Psych No problems in this area  Prior Studies Any changes since last visit?  yes CT/MRI  TIA 07/20/2011  Physicians involved in your care Any changes since last visit?  no Primary care Alinda Deem      Review of Systems  All other systems reviewed and are negative.       Objective:   Physical Exam  Constitutional: She appears well-developed and well-nourished.  Neck: Normal range of motion. Neck supple.  Musculoskeletal:       Right wrist: She exhibits bony tenderness.       Right forearm: She exhibits bony tenderness.       Right hand: She exhibits decreased range of motion and bony tenderness.       Left hand: She exhibits  bony tenderness.       Severe arthritic deformities in the PIPs and DIPs  Neurological: She has normal strength. No cranial nerve deficit or sensory deficit. She displays a negative Romberg sign. Gait normal.       Support during ambulation wide       Assessment & Plan:  1. Left pontine infarct with residual balance disorder. She's had a TIA causing what appears to be either increased dysarthria or aphasia. Will make a referral to neurology to further assess. She is oriented Plavix. Question whether Aggrenox may be a better choice. 2. Osteoarthritis bilateral hands we'll trial diclofenac gel 3. RTC in 2 months

## 2011-07-25 ENCOUNTER — Telehealth: Payer: Self-pay | Admitting: Physical Medicine & Rehabilitation

## 2011-07-25 NOTE — Telephone Encounter (Signed)
Discharging patient today.  Patient has met all her goals.

## 2011-08-20 ENCOUNTER — Encounter: Payer: Self-pay | Admitting: Physical Medicine & Rehabilitation

## 2011-09-10 ENCOUNTER — Encounter: Payer: Self-pay | Admitting: Physical Medicine & Rehabilitation

## 2011-09-24 ENCOUNTER — Ambulatory Visit (HOSPITAL_BASED_OUTPATIENT_CLINIC_OR_DEPARTMENT_OTHER): Payer: Medicare Other | Admitting: Physical Medicine & Rehabilitation

## 2011-09-24 ENCOUNTER — Encounter: Payer: Medicare Other | Attending: Physical Medicine & Rehabilitation

## 2011-09-24 ENCOUNTER — Encounter: Payer: Self-pay | Admitting: Physical Medicine & Rehabilitation

## 2011-09-24 VITALS — BP 133/46 | HR 71 | Ht 62.0 in | Wt 147.0 lb

## 2011-09-24 DIAGNOSIS — I69993 Ataxia following unspecified cerebrovascular disease: Secondary | ICD-10-CM

## 2011-09-24 DIAGNOSIS — M19049 Primary osteoarthritis, unspecified hand: Secondary | ICD-10-CM | POA: Insufficient documentation

## 2011-09-24 DIAGNOSIS — R279 Unspecified lack of coordination: Secondary | ICD-10-CM | POA: Insufficient documentation

## 2011-09-24 DIAGNOSIS — I6350 Cerebral infarction due to unspecified occlusion or stenosis of unspecified cerebral artery: Secondary | ICD-10-CM

## 2011-09-24 DIAGNOSIS — I69998 Other sequelae following unspecified cerebrovascular disease: Secondary | ICD-10-CM | POA: Insufficient documentation

## 2011-09-24 DIAGNOSIS — I639 Cerebral infarction, unspecified: Secondary | ICD-10-CM

## 2011-09-24 MED ORDER — DICLOFENAC SODIUM 1 % TD GEL: 1.0000 "application " | Freq: Four times a day (QID) | TRANSDERMAL | Status: DC

## 2011-09-24 NOTE — Progress Notes (Deleted)
  Subjective:    Patient ID: Kimberly Lester, female    DOB: 09-24-1930, 78 y.o.   MRN: [id number]  HPI    Review of Systems     Objective:   Physical Exam        Assessment & Plan:

## 2011-09-24 NOTE — Patient Instructions (Signed)

## 2011-09-24 NOTE — Progress Notes (Signed)
  Subjective:    Patient ID: Kimberly Lester, female    DOB: 1930-09-02, 78 y.o.   MRN: [id number]  HPI  Pain Inventory Average Pain 0 Pain Right Now 0 My pain is n/a  In the last 24 hours, has pain interfered with the following? General activity 0 Relation with others 0 Enjoyment of life 0 What TIME of day is your pain at its worst? n/a Sleep (in general) Fair  Pain is worse with: n/a Pain improves with: n/a Relief from Meds: 0  Mobility walk with assistance use a cane how many minutes can you walk? 15 min ability to climb steps?  yes do you drive?  no transfers alone Do you have any goals in this area?  yes  Function retired Do you have any goals in this area?  no  Neuro/Psych No problems in this area  Prior Studies Any changes since last visit?  no  Physicians involved in your care Any changes since last visit?  no       Review of Systems  Constitutional: Negative.   HENT: Negative.   Eyes: Negative.   Respiratory: Negative.   Cardiovascular: Negative.   Gastrointestinal: Positive for constipation.  Genitourinary: Negative.   Musculoskeletal: Negative.   Skin: Negative.   Neurological: Negative.   Hematological: Negative.   Psychiatric/Behavioral: Negative.        Objective:   Physical Exam  Her strength is 5/5 in bilateral upper and lower extremities Is mild fine motor deficit in the right upper extremity. Arthritic changes noted in bilateral hands at the PIPs and DIPs     Assessment & Plan:  Left pontine infarct has mild residual fine motor deficits in the right upper extremity. Otherwise excellent movement in overall functioning. Still requires a cane for balance. Recommend continue using cane. She'll followup with neurology as well as her primary care physician. I'll see her back on a when necessary basis

## 2013-03-17 ENCOUNTER — Telehealth: Payer: Self-pay

## 2013-03-17 NOTE — Telephone Encounter (Signed)
Please get Rx from PCP

## 2013-03-17 NOTE — Telephone Encounter (Signed)
Refill request from [facility] for voltaren gel.  Patient not seen since may and was to return PRN.  Please advise.

## 2013-03-18 NOTE — Telephone Encounter (Signed)
Advised pharmacy to have patient contact PCP for voltaren gel refill per Dr Wynn Banker.

## 2013-03-19 ENCOUNTER — Other Ambulatory Visit: Payer: Self-pay | Admitting: *Deleted

## 2015-08-04 DIAGNOSIS — E7800 Pure hypercholesterolemia, unspecified: Secondary | ICD-10-CM | POA: Diagnosis not present

## 2015-08-04 DIAGNOSIS — E119 Type 2 diabetes mellitus without complications: Secondary | ICD-10-CM | POA: Diagnosis not present

## 2015-08-04 DIAGNOSIS — I10 Essential (primary) hypertension: Secondary | ICD-10-CM | POA: Diagnosis not present

## 2015-08-04 DIAGNOSIS — Z8673 Personal history of transient ischemic attack (TIA), and cerebral infarction without residual deficits: Secondary | ICD-10-CM | POA: Diagnosis not present

## 2015-09-08 DIAGNOSIS — L570 Actinic keratosis: Secondary | ICD-10-CM | POA: Diagnosis not present

## 2015-09-08 DIAGNOSIS — L821 Other seborrheic keratosis: Secondary | ICD-10-CM | POA: Diagnosis not present

## 2015-09-08 DIAGNOSIS — L578 Other skin changes due to chronic exposure to nonionizing radiation: Secondary | ICD-10-CM | POA: Diagnosis not present

## 2015-09-08 DIAGNOSIS — L219 Seborrheic dermatitis, unspecified: Secondary | ICD-10-CM | POA: Diagnosis not present

## 2015-09-08 DIAGNOSIS — L728 Other follicular cysts of the skin and subcutaneous tissue: Secondary | ICD-10-CM | POA: Diagnosis not present

## 2015-11-03 DIAGNOSIS — Z8673 Personal history of transient ischemic attack (TIA), and cerebral infarction without residual deficits: Secondary | ICD-10-CM | POA: Diagnosis not present

## 2015-11-03 DIAGNOSIS — Z79899 Other long term (current) drug therapy: Secondary | ICD-10-CM | POA: Diagnosis not present

## 2015-11-03 DIAGNOSIS — E7800 Pure hypercholesterolemia, unspecified: Secondary | ICD-10-CM | POA: Diagnosis not present

## 2015-11-03 DIAGNOSIS — I10 Essential (primary) hypertension: Secondary | ICD-10-CM | POA: Diagnosis not present

## 2015-11-03 DIAGNOSIS — E119 Type 2 diabetes mellitus without complications: Secondary | ICD-10-CM | POA: Diagnosis not present

## 2015-12-11 DIAGNOSIS — Z96641 Presence of right artificial hip joint: Secondary | ICD-10-CM | POA: Diagnosis not present

## 2015-12-11 DIAGNOSIS — M79605 Pain in left leg: Secondary | ICD-10-CM | POA: Diagnosis not present

## 2015-12-11 DIAGNOSIS — M25562 Pain in left knee: Secondary | ICD-10-CM | POA: Diagnosis not present

## 2015-12-11 DIAGNOSIS — M25552 Pain in left hip: Secondary | ICD-10-CM | POA: Diagnosis not present

## 2015-12-11 DIAGNOSIS — M79662 Pain in left lower leg: Secondary | ICD-10-CM | POA: Diagnosis not present

## 2016-01-11 DIAGNOSIS — I87333 Chronic venous hypertension (idiopathic) with ulcer and inflammation of bilateral lower extremity: Secondary | ICD-10-CM | POA: Diagnosis not present

## 2016-02-05 DIAGNOSIS — E7800 Pure hypercholesterolemia, unspecified: Secondary | ICD-10-CM | POA: Diagnosis not present

## 2016-02-05 DIAGNOSIS — I10 Essential (primary) hypertension: Secondary | ICD-10-CM | POA: Diagnosis not present

## 2016-02-05 DIAGNOSIS — E119 Type 2 diabetes mellitus without complications: Secondary | ICD-10-CM | POA: Diagnosis not present

## 2016-02-15 DIAGNOSIS — I87333 Chronic venous hypertension (idiopathic) with ulcer and inflammation of bilateral lower extremity: Secondary | ICD-10-CM | POA: Diagnosis not present

## 2016-02-15 DIAGNOSIS — M5442 Lumbago with sciatica, left side: Secondary | ICD-10-CM | POA: Diagnosis not present

## 2016-02-15 DIAGNOSIS — M5441 Lumbago with sciatica, right side: Secondary | ICD-10-CM | POA: Diagnosis not present

## 2016-03-25 ENCOUNTER — Ambulatory Visit (INDEPENDENT_AMBULATORY_CARE_PROVIDER_SITE_OTHER): Payer: Self-pay | Admitting: Orthopedic Surgery

## 2016-05-09 DIAGNOSIS — E7800 Pure hypercholesterolemia, unspecified: Secondary | ICD-10-CM | POA: Diagnosis not present

## 2016-05-09 DIAGNOSIS — E119 Type 2 diabetes mellitus without complications: Secondary | ICD-10-CM | POA: Diagnosis not present

## 2016-05-09 DIAGNOSIS — Z0000 Encounter for general adult medical examination without abnormal findings: Secondary | ICD-10-CM | POA: Diagnosis not present

## 2016-05-09 DIAGNOSIS — Z6828 Body mass index (BMI) 28.0-28.9, adult: Secondary | ICD-10-CM | POA: Diagnosis not present

## 2016-05-09 DIAGNOSIS — M8589 Other specified disorders of bone density and structure, multiple sites: Secondary | ICD-10-CM | POA: Diagnosis not present

## 2016-05-09 DIAGNOSIS — E663 Overweight: Secondary | ICD-10-CM | POA: Diagnosis not present

## 2016-05-09 DIAGNOSIS — G609 Hereditary and idiopathic neuropathy, unspecified: Secondary | ICD-10-CM | POA: Diagnosis not present

## 2016-05-09 DIAGNOSIS — Z9181 History of falling: Secondary | ICD-10-CM | POA: Diagnosis not present

## 2016-05-09 DIAGNOSIS — I10 Essential (primary) hypertension: Secondary | ICD-10-CM | POA: Diagnosis not present

## 2016-05-09 DIAGNOSIS — Z79899 Other long term (current) drug therapy: Secondary | ICD-10-CM | POA: Diagnosis not present

## 2016-05-16 DIAGNOSIS — E538 Deficiency of other specified B group vitamins: Secondary | ICD-10-CM | POA: Diagnosis not present

## 2016-06-17 DIAGNOSIS — J069 Acute upper respiratory infection, unspecified: Secondary | ICD-10-CM | POA: Diagnosis not present

## 2016-06-17 DIAGNOSIS — E538 Deficiency of other specified B group vitamins: Secondary | ICD-10-CM | POA: Diagnosis not present

## 2016-08-08 DIAGNOSIS — E119 Type 2 diabetes mellitus without complications: Secondary | ICD-10-CM | POA: Diagnosis not present

## 2016-08-08 DIAGNOSIS — E7800 Pure hypercholesterolemia, unspecified: Secondary | ICD-10-CM | POA: Diagnosis not present

## 2016-08-08 DIAGNOSIS — E538 Deficiency of other specified B group vitamins: Secondary | ICD-10-CM | POA: Diagnosis not present

## 2016-08-08 DIAGNOSIS — I10 Essential (primary) hypertension: Secondary | ICD-10-CM | POA: Diagnosis not present

## 2016-10-03 DIAGNOSIS — L578 Other skin changes due to chronic exposure to nonionizing radiation: Secondary | ICD-10-CM | POA: Diagnosis not present

## 2016-10-03 DIAGNOSIS — L821 Other seborrheic keratosis: Secondary | ICD-10-CM | POA: Diagnosis not present

## 2016-10-03 DIAGNOSIS — L219 Seborrheic dermatitis, unspecified: Secondary | ICD-10-CM | POA: Diagnosis not present

## 2016-10-03 DIAGNOSIS — L570 Actinic keratosis: Secondary | ICD-10-CM | POA: Diagnosis not present

## 2016-11-07 DIAGNOSIS — E7800 Pure hypercholesterolemia, unspecified: Secondary | ICD-10-CM | POA: Diagnosis not present

## 2016-11-07 DIAGNOSIS — I10 Essential (primary) hypertension: Secondary | ICD-10-CM | POA: Diagnosis not present

## 2016-11-07 DIAGNOSIS — E119 Type 2 diabetes mellitus without complications: Secondary | ICD-10-CM | POA: Diagnosis not present

## 2016-11-26 DIAGNOSIS — E119 Type 2 diabetes mellitus without complications: Secondary | ICD-10-CM | POA: Diagnosis not present

## 2016-11-26 DIAGNOSIS — H25813 Combined forms of age-related cataract, bilateral: Secondary | ICD-10-CM | POA: Diagnosis not present

## 2016-12-13 DIAGNOSIS — J0100 Acute maxillary sinusitis, unspecified: Secondary | ICD-10-CM | POA: Diagnosis not present

## 2016-12-13 DIAGNOSIS — J209 Acute bronchitis, unspecified: Secondary | ICD-10-CM | POA: Diagnosis not present

## 2016-12-13 DIAGNOSIS — H6123 Impacted cerumen, bilateral: Secondary | ICD-10-CM | POA: Diagnosis not present

## 2017-04-02 DIAGNOSIS — E538 Deficiency of other specified B group vitamins: Secondary | ICD-10-CM | POA: Diagnosis not present

## 2017-04-02 DIAGNOSIS — Z79899 Other long term (current) drug therapy: Secondary | ICD-10-CM | POA: Diagnosis not present

## 2017-04-02 DIAGNOSIS — E559 Vitamin D deficiency, unspecified: Secondary | ICD-10-CM | POA: Diagnosis not present

## 2017-04-02 DIAGNOSIS — Z6828 Body mass index (BMI) 28.0-28.9, adult: Secondary | ICD-10-CM | POA: Diagnosis not present

## 2017-04-02 DIAGNOSIS — K219 Gastro-esophageal reflux disease without esophagitis: Secondary | ICD-10-CM | POA: Diagnosis not present

## 2017-04-02 DIAGNOSIS — E663 Overweight: Secondary | ICD-10-CM | POA: Diagnosis not present

## 2017-04-02 DIAGNOSIS — G2581 Restless legs syndrome: Secondary | ICD-10-CM | POA: Diagnosis not present

## 2017-04-02 DIAGNOSIS — E7800 Pure hypercholesterolemia, unspecified: Secondary | ICD-10-CM | POA: Diagnosis not present

## 2017-04-02 DIAGNOSIS — E119 Type 2 diabetes mellitus without complications: Secondary | ICD-10-CM | POA: Diagnosis not present

## 2017-04-02 DIAGNOSIS — I10 Essential (primary) hypertension: Secondary | ICD-10-CM | POA: Diagnosis not present

## 2017-04-02 DIAGNOSIS — Z1389 Encounter for screening for other disorder: Secondary | ICD-10-CM | POA: Diagnosis not present

## 2017-04-24 DIAGNOSIS — M25552 Pain in left hip: Secondary | ICD-10-CM | POA: Diagnosis not present

## 2017-04-24 DIAGNOSIS — G8929 Other chronic pain: Secondary | ICD-10-CM | POA: Diagnosis not present

## 2017-04-24 DIAGNOSIS — M1712 Unilateral primary osteoarthritis, left knee: Secondary | ICD-10-CM | POA: Diagnosis not present

## 2017-04-24 DIAGNOSIS — M25562 Pain in left knee: Secondary | ICD-10-CM | POA: Diagnosis not present

## 2017-04-24 DIAGNOSIS — M1612 Unilateral primary osteoarthritis, left hip: Secondary | ICD-10-CM | POA: Diagnosis not present

## 2017-04-24 DIAGNOSIS — Z96641 Presence of right artificial hip joint: Secondary | ICD-10-CM | POA: Diagnosis not present

## 2017-05-22 DIAGNOSIS — M1612 Unilateral primary osteoarthritis, left hip: Secondary | ICD-10-CM | POA: Diagnosis not present

## 2017-05-23 ENCOUNTER — Other Ambulatory Visit: Payer: Self-pay | Admitting: Orthopedic Surgery

## 2017-06-02 ENCOUNTER — Encounter (HOSPITAL_COMMUNITY): Payer: Self-pay

## 2017-06-02 ENCOUNTER — Other Ambulatory Visit: Payer: Self-pay | Admitting: Orthopedic Surgery

## 2017-06-02 NOTE — Pre-Procedure Instructions (Signed)
Kimberly Lester  06/02/2017      [facility] - [place], [place] - [address] [place] [address] Phone: [phone] Fax: [phone]    Your procedure is scheduled on Monday January 21.  Report to [facility] Admitting at 8:00 A.M.  Call this number if you have problems the morning of surgery:  [phone]   Remember:  Do not eat food or drink liquids after midnight.  Take these medicines the morning of surgery with A SIP OF WATER:   Metoprolol (toprol-XL) Amlodipine (norvasc) Duloxetine (cymbalta) Lansoprazole (Prevacid) Tramadol (ultram) if needed Acetaminophen (tylenol) if needed  7 days prior to surgery STOP taking any diclofenac (voltaren), Aleve, Naproxen, Ibuprofen, Motrin, Advil, Goody's, BC's, all herbal medications, fish oil, and all vitamins  **FOLLOW your surgeon's instructions on stopping Aspirin and Plavix. If no instructions were given, please call surgeon's office**    Do not wear jewelry, make-up or nail polish.  Do not wear lotions, powders, or perfumes, or deodorant.  Do not shave 48 hours prior to surgery.  Men may shave face and neck.  Do not bring valuables to the hospital.  [facility] is not responsible for any belongings or valuables.  Contacts, dentures or bridgework may not be worn into surgery.  Leave your suitcase in the car.  After surgery it may be brought to your room.  For patients admitted to the hospital, discharge time will be determined by your treatment team.  Patients discharged the day of surgery will not be allowed to drive home.   Special instructions:    [place]- Preparing For Surgery  Before surgery, you can play an important role. Because skin is not sterile, your skin needs to be as free of germs as possible. You can reduce the number of germs on your skin by washing with CHG (chlorahexidine gluconate) Soap before surgery.  CHG is an antiseptic cleaner which kills germs  and bonds with the skin to continue killing germs even after washing.  Please do not use if you have an allergy to CHG or antibacterial soaps. If your skin becomes reddened/irritated stop using the CHG.  Do not shave (including legs and underarms) for at least 48 hours prior to first CHG shower. It is OK to shave your face.  Please follow these instructions carefully.   1. Shower the NIGHT BEFORE SURGERY and the MORNING OF SURGERY with CHG.   2. If you chose to wash your hair, wash your hair first as usual with your normal shampoo.  3. After you shampoo, rinse your hair and body thoroughly to remove the shampoo.  4. Use CHG as you would any other liquid soap. You can apply CHG directly to the skin and wash gently with a scrungie or a clean washcloth.   5. Apply the CHG Soap to your body ONLY FROM THE NECK DOWN.  Do not use on open wounds or open sores. Avoid contact with your eyes, ears, mouth and genitals (private parts). Wash Face and genitals (private parts)  with your normal soap.  6. Wash thoroughly, paying special attention to the area where your surgery will be performed.  7. Thoroughly rinse your body with warm water from the neck down.  8. DO NOT shower/wash with your normal soap after using and rinsing off the CHG Soap.  9. Pat yourself dry with a CLEAN TOWEL.  10. Wear CLEAN PAJAMAS to bed the night before surgery,  wear comfortable clothes the morning of surgery  11. Place CLEAN SHEETS on your bed the night of your first shower and DO NOT SLEEP WITH PETS.    Day of Surgery: Do not apply any deodorants/lotions. Please wear clean clothes to the hospital/surgery center.      Please read over the following fact sheets that you were given. Coughing and Deep Breathing, Total Joint Packet and Surgical Site Infection Prevention

## 2017-06-03 ENCOUNTER — Encounter (HOSPITAL_COMMUNITY)
Admission: RE | Admit: 2017-06-03 | Discharge: 2017-06-03 | Disposition: A | Discharge status: Home / Self Care | Payer: PPO | Source: Ambulatory Visit | Attending: Orthopedic Surgery | Admitting: Orthopedic Surgery

## 2017-06-03 ENCOUNTER — Encounter (HOSPITAL_COMMUNITY): Payer: Self-pay

## 2017-06-03 ENCOUNTER — Other Ambulatory Visit: Payer: Self-pay

## 2017-06-03 DIAGNOSIS — Z01810 Encounter for preprocedural cardiovascular examination: Secondary | ICD-10-CM | POA: Diagnosis not present

## 2017-06-03 DIAGNOSIS — I10 Essential (primary) hypertension: Secondary | ICD-10-CM | POA: Diagnosis not present

## 2017-06-03 DIAGNOSIS — Z8673 Personal history of transient ischemic attack (TIA), and cerebral infarction without residual deficits: Secondary | ICD-10-CM | POA: Insufficient documentation

## 2017-06-03 DIAGNOSIS — Z01818 Encounter for other preprocedural examination: Secondary | ICD-10-CM | POA: Diagnosis not present

## 2017-06-03 DIAGNOSIS — Z01812 Encounter for preprocedural laboratory examination: Secondary | ICD-10-CM | POA: Insufficient documentation

## 2017-06-03 DIAGNOSIS — Z7982 Long term (current) use of aspirin: Secondary | ICD-10-CM | POA: Diagnosis not present

## 2017-06-03 DIAGNOSIS — K219 Gastro-esophageal reflux disease without esophagitis: Secondary | ICD-10-CM | POA: Insufficient documentation

## 2017-06-03 DIAGNOSIS — E7800 Pure hypercholesterolemia, unspecified: Secondary | ICD-10-CM | POA: Insufficient documentation

## 2017-06-03 DIAGNOSIS — Z79899 Other long term (current) drug therapy: Secondary | ICD-10-CM | POA: Diagnosis not present

## 2017-06-03 LAB — PROTIME-INR
INR: 1.01
PROTHROMBIN TIME: 13.3 s (ref 11.4–15.2)

## 2017-06-03 LAB — CBC WITH DIFFERENTIAL/PLATELET
BASOS PCT: 0 %
Basophils Absolute: 0 10*3/uL (ref 0.0–0.1)
EOS PCT: 2 %
Eosinophils Absolute: 0.1 10*3/uL (ref 0.0–0.7)
HCT: 39.4 % (ref 36.0–46.0)
HEMOGLOBIN: 12.4 g/dL (ref 12.0–15.0)
Lymphocytes Relative: 22 %
Lymphs Abs: 1.2 10*3/uL (ref 0.7–4.0)
MCH: 28.8 pg (ref 26.0–34.0)
MCHC: 31.5 g/dL (ref 30.0–36.0)
MCV: 91.6 fL (ref 78.0–100.0)
Monocytes Absolute: 0.3 10*3/uL (ref 0.1–1.0)
Monocytes Relative: 6 %
NEUTROS PCT: 70 %
Neutro Abs: 3.6 10*3/uL (ref 1.7–7.7)
PLATELETS: 196 10*3/uL (ref 150–400)
RBC: 4.3 MIL/uL (ref 3.87–5.11)
RDW: 14.4 % (ref 11.5–15.5)
WBC: 5.3 10*3/uL (ref 4.0–10.5)

## 2017-06-03 LAB — TYPE AND SCREEN
ABO/RH(D): A NEG
ANTIBODY SCREEN: NEGATIVE

## 2017-06-03 LAB — BASIC METABOLIC PANEL
Anion gap: 11 (ref 5–15)
BUN: 18 mg/dL (ref 6–20)
CALCIUM: 9.3 mg/dL (ref 8.9–10.3)
CO2: 26 mmol/L (ref 22–32)
CREATININE: 1.04 mg/dL — AB (ref 0.44–1.00)
Chloride: 100 mmol/L — ABNORMAL LOW (ref 101–111)
GFR, EST AFRICAN AMERICAN: 56 mL/min — AB (ref >60)
GFR, EST NON AFRICAN AMERICAN: 48 mL/min — AB (ref >60)
Glucose, Bld: 117 mg/dL — ABNORMAL HIGH (ref 65–99)
Potassium: 3.5 mmol/L (ref 3.5–5.1)
SODIUM: 137 mmol/L (ref 135–145)

## 2017-06-03 LAB — APTT: APTT: 31 s (ref 24–36)

## 2017-06-03 LAB — URINALYSIS, ROUTINE W REFLEX MICROSCOPIC
Bilirubin Urine: NEGATIVE
GLUCOSE, UA: NEGATIVE mg/dL
HGB URINE DIPSTICK: NEGATIVE
Ketones, ur: NEGATIVE mg/dL
Leukocytes, UA: NEGATIVE
Nitrite: NEGATIVE
PH: 6 (ref 5.0–8.0)
Protein, ur: NEGATIVE mg/dL
Specific Gravity, Urine: 1.021 (ref 1.005–1.030)

## 2017-06-03 LAB — ABO/RH: ABO/RH(D): A NEG

## 2017-06-03 LAB — SURGICAL PCR SCREEN
MRSA, PCR: NEGATIVE
STAPHYLOCOCCUS AUREUS: NEGATIVE

## 2017-06-03 NOTE — Progress Notes (Signed)
PCP - Greig Right Cardiologist - denies cardiac workup or cardiologist  Chest x-ray - 06/03/2017  EKG - 06/03/2017  ECHO - 2013  Pt last dose of Plavix was 06/01/17, pt will continue to take ASA 81mg  per Dr. Damita Dunnings office    Patient denies shortness of breath, fever, cough and chest pain at PAT appointment   Patient verbalized understanding of instructions that were given to them at the PAT appointment. Patient was also instructed that they will need to review over the PAT instructions again at home before surgery.

## 2017-06-04 ENCOUNTER — Encounter (HOSPITAL_COMMUNITY): Payer: Self-pay | Admitting: Vascular Surgery

## 2017-06-04 DIAGNOSIS — M1612 Unilateral primary osteoarthritis, left hip: Secondary | ICD-10-CM | POA: Diagnosis present

## 2017-06-04 NOTE — H&P (View-Only) (Signed)
TOTAL HIP ADMISSION H&P  Patient is admitted for left total hip arthroplasty.  Subjective:  Chief Complaint: left hip pain  HPI: Kimberly Lester, 84 y.o. female, has a history of pain and functional disability in the left hip(s) due to arthritis and patient has failed non-surgical conservative treatments for greater than 12 weeks to include NSAID's and/or analgesics, flexibility and strengthening excercises, use of assistive devices and activity modification.  Onset of symptoms was gradual starting several years ago with gradually worsening course since that time.The patient noted no past surgery on the left hip(s).  Patient currently rates pain in the left hip at 10 out of 10 with activity. Patient has night pain, worsening of pain with activity and weight bearing, trendelenberg gait, pain that interfers with activities of daily living and pain with passive range of motion. Patient has evidence of periarticular osteophytes and joint space narrowing by imaging studies. This condition presents safety issues increasing the risk of falls.  There is no current active infection.  Patient Active Problem List   Diagnosis Date Noted  . 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 Osteoarthritis of finger 07/23/2011  . CVA (cerebral infarction) 06/19/2011   Past Medical History:  Diagnosis Date  . GERD (gastroesophageal reflux disease)   . Hypercholesterolemia   . Hypertension   . Osteoarthritis   . Restless leg syndrome   . Stroke ([place]) 06/16/2011   some balance issues, finger numbness  . TIA (transient ischemic attack) 07/20/2011    Past Surgical History:  Procedure Laterality Date  . TOTAL HIP ARTHROPLASTY  07/12/10   at [facility]    No current facility-administered medications for this encounter.    Current Outpatient Medications  Medication Sig Dispense Refill Last Dose  . acetaminophen (TYLENOL) 500 MG tablet Take 500 mg by mouth every 6 (six) hours as  needed for mild pain.    Not Taking  . amLODipine (NORVASC) 5 MG tablet Take 5 mg by mouth daily.     Marland Kitchen aspirin EC 81 MG tablet Take 81 mg by mouth daily.     . clopidogrel (PLAVIX) 75 MG tablet Take 1 tablet (75 mg total) by mouth daily. 30 tablet 1 Not Taking  . DULoxetine (CYMBALTA) 30 MG capsule Take 30 mg by mouth 2 (two) times daily.     Marland Kitchen gabapentin (NEURONTIN) 100 MG capsule Take 200 mg by mouth at bedtime.     . hydrochlorothiazide (HYDRODIURIL) 25 MG tablet Take 25 mg by mouth daily.     . lansoprazole (PREVACID) 30 MG capsule Take 30 mg by mouth daily at 12 noon.     Marland Kitchen lisinopril (PRINIVIL,ZESTRIL) 20 MG tablet Take a whole pill in am and half a pill in pm. (Patient taking differently: Take 20 mg by mouth daily. ) 45 tablet 1 Taking  . meclizine (ANTIVERT) 25 MG tablet Take 25 mg by mouth 3 (three) times daily as needed for dizziness.    Not Taking  . methyl salicylate-menthol (BENGAY) ointment Apply 1 application topically as needed for pain. For muscle/joint pain    Not Taking  . metoprolol succinate (TOPROL-XL) 50 MG 24 hr tablet Take 1 tablet (50 mg total) by mouth 2 (two) times daily. Take with or immediately following a meal. (Patient taking differently: Take 50-100 mg by mouth 2 (two) times daily. Take 1 tablet in the morning and 2 tablets at night) 60 tablet 1 Taking  . psyllium (METAMUCIL SMOOTH TEXTURE) 28 % packet Take 1 packet  by mouth daily.     Marland Kitchen rOPINIRole (REQUIP) 1 MG tablet Take 2 mg by mouth at bedtime.     . rosuvastatin (CRESTOR) 40 MG tablet Take 40 mg by mouth every evening.     . traMADol (ULTRAM) 50 MG tablet Take 50 mg by mouth every 6 (six) hours as needed for moderate pain.    Taking  . diclofenac sodium (VOLTAREN) 1 % GEL Apply 1 application topically 4 (four) times daily. (Patient not taking: Reported on 05/28/2017) 3 Tube 5 Not Taking at Unknown time  . hydrochlorothiazide (MICROZIDE) 12.5 MG capsule Take 1 capsule (12.5 mg total) by mouth daily. 30 capsule 1  Taking  . rOPINIRole (REQUIP) 0.5 MG tablet Take 1 tablet (0.5 mg total) by mouth at bedtime. 30 tablet 1 Taking  . senna (SENOKOT) 8.6 MG TABS Take 2 tablets (17.2 mg total) by mouth at bedtime. (Patient not taking: Reported on 05/28/2017) 120 each  Not Taking at Unknown time  . simvastatin (ZOCOR) 40 MG tablet Take 1 tablet (40 mg total) by mouth every evening. (Patient not taking: Reported on 05/28/2017) 30 tablet 1 Not Taking at Unknown time   Allergies  Allergen Reactions  . Lorazepam Other (See Comments)    "makes her crazy"  . Percocet [Oxycodone-Acetaminophen] Nausea And Vomiting    Social History   Tobacco Use  . Smoking status: Never Smoker  . Smokeless tobacco: Never Used  Substance Use Topics  . Alcohol use: No    Frequency: Never    Family History  Problem Relation Age of Onset  . Tuberculosis Mother   . COPD Father   . Coronary artery disease Father      Review of Systems  Constitutional: Negative.   HENT: Negative.   Eyes: Negative.   Respiratory: Negative.   Cardiovascular:       HTN  Gastrointestinal: Positive for constipation.  Genitourinary: Negative.   Musculoskeletal: Positive for joint pain.  Skin: Negative.   Neurological:       Poor balance  Endo/Heme/Allergies: Negative.   Psychiatric/Behavioral: Negative.     Objective:  Physical Exam  Constitutional: She is oriented to person, place, and time. She appears well-developed and well-nourished.  HENT:  Head: Normocephalic and atraumatic.  Eyes: Pupils are equal, round, and reactive to light.  Neck: Normal range of motion. Neck supple.  Cardiovascular: Intact distal pulses.  Respiratory: Effort normal.  Musculoskeletal:  She walks with a profound antalgic gait on the left side, any attempts at internal rotation left hip causes severe pain.  Internal rotation blocks at around 0.  External rotation is to maybe 20 or 30.  Foot tap is negative.    Neurological: She is alert and oriented to person,  place, and time.  Skin: Skin is warm and dry.  Psychiatric: She has a normal mood and affect. Her behavior is normal. Judgment and thought content normal.    Vital signs in last 24 hours: Temp:  [98 F (36.7 C)] 98 F (36.7 C) (01/15 1417) Pulse Rate:  [81] 81 (01/15 1417) BP: (108)/(60) 108/60 (01/15 1417) SpO2:  [96 %] 96 % (01/15 1417) Weight:  [72.3 kg (159 lb 4.8 oz)] 72.3 kg (159 lb 4.8 oz) (01/15 1417)  Labs:   Estimated body mass index is 29.14 kg/m as calculated from the following:   Height as of 06/03/17: 5\' 2"  (1.575 m).   Weight as of 06/03/17: 72.3 kg (159 lb 4.8 oz).   Imaging Review Plain radiographs demonstrate  End-stage arthritis of the left hip bone-on-bone with peripheral osteophytes  Assessment/Plan:  End stage arthritis, left hip(s)  The patient history, physical examination, clinical judgement of the provider and imaging studies are consistent with end stage degenerative joint disease of the left hip(s) and total hip arthroplasty is deemed medically necessary. The treatment options including medical management, injection therapy, arthroscopy and arthroplasty were discussed at length. The risks and benefits of total hip arthroplasty were presented and reviewed. The risks due to aseptic loosening, infection, stiffness, dislocation/subluxation,  thromboembolic complications and other imponderables were discussed.  The patient acknowledged the explanation, agreed to proceed with the plan and consent was signed. Patient is being admitted for inpatient treatment for surgery, pain control, PT, OT, prophylactic antibiotics, VTE prophylaxis, progressive ambulation and ADL's and discharge planning.The patient is planning to be discharged home with home health services.

## 2017-06-04 NOTE — H&P (Signed)
TOTAL HIP ADMISSION H&P  Patient is admitted for left total hip arthroplasty.  Subjective:  Chief Complaint: left hip pain  HPI: Kimberly Lester, 84 y.o. female, has a history of pain and functional disability in the left hip(s) due to arthritis and patient has failed non-surgical conservative treatments for greater than 12 weeks to include NSAID's and/or analgesics, flexibility and strengthening excercises, use of assistive devices and activity modification.  Onset of symptoms was gradual starting several years ago with gradually worsening course since that time.The patient noted no past surgery on the left hip(s).  Patient currently rates pain in the left hip at 10 out of 10 with activity. Patient has night pain, worsening of pain with activity and weight bearing, trendelenberg gait, pain that interfers with activities of daily living and pain with passive range of motion. Patient has evidence of periarticular osteophytes and joint space narrowing by imaging studies. This condition presents safety issues increasing the risk of falls.  There is no current active infection.  Patient Active Problem List   Diagnosis Date Noted  . 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 Osteoarthritis of finger 07/23/2011  . CVA (cerebral infarction) 06/19/2011   Past Medical History:  Diagnosis Date  . GERD (gastroesophageal reflux disease)   . Hypercholesterolemia   . Hypertension   . Osteoarthritis   . Restless leg syndrome   . Stroke ([place]) 06/16/2011   some balance issues, finger numbness  . TIA (transient ischemic attack) 07/20/2011    Past Surgical History:  Procedure Laterality Date  . TOTAL HIP ARTHROPLASTY  07/12/10   at [facility]    No current facility-administered medications for this encounter.    Current Outpatient Medications  Medication Sig Dispense Refill Last Dose  . acetaminophen (TYLENOL) 500 MG tablet Take 500 mg by mouth every 6 (six) hours as  needed for mild pain.    Not Taking  . amLODipine (NORVASC) 5 MG tablet Take 5 mg by mouth daily.     Marland Kitchen aspirin EC 81 MG tablet Take 81 mg by mouth daily.     . clopidogrel (PLAVIX) 75 MG tablet Take 1 tablet (75 mg total) by mouth daily. 30 tablet 1 Not Taking  . DULoxetine (CYMBALTA) 30 MG capsule Take 30 mg by mouth 2 (two) times daily.     Marland Kitchen gabapentin (NEURONTIN) 100 MG capsule Take 200 mg by mouth at bedtime.     . hydrochlorothiazide (HYDRODIURIL) 25 MG tablet Take 25 mg by mouth daily.     . lansoprazole (PREVACID) 30 MG capsule Take 30 mg by mouth daily at 12 noon.     Marland Kitchen lisinopril (PRINIVIL,ZESTRIL) 20 MG tablet Take a whole pill in am and half a pill in pm. (Patient taking differently: Take 20 mg by mouth daily. ) 45 tablet 1 Taking  . meclizine (ANTIVERT) 25 MG tablet Take 25 mg by mouth 3 (three) times daily as needed for dizziness.    Not Taking  . methyl salicylate-menthol (BENGAY) ointment Apply 1 application topically as needed for pain. For muscle/joint pain    Not Taking  . metoprolol succinate (TOPROL-XL) 50 MG 24 hr tablet Take 1 tablet (50 mg total) by mouth 2 (two) times daily. Take with or immediately following a meal. (Patient taking differently: Take 50-100 mg by mouth 2 (two) times daily. Take 1 tablet in the morning and 2 tablets at night) 60 tablet 1 Taking  . psyllium (METAMUCIL SMOOTH TEXTURE) 28 % packet Take 1 packet  by mouth daily.     Marland Kitchen rOPINIRole (REQUIP) 1 MG tablet Take 2 mg by mouth at bedtime.     . rosuvastatin (CRESTOR) 40 MG tablet Take 40 mg by mouth every evening.     . traMADol (ULTRAM) 50 MG tablet Take 50 mg by mouth every 6 (six) hours as needed for moderate pain.    Taking  . diclofenac sodium (VOLTAREN) 1 % GEL Apply 1 application topically 4 (four) times daily. (Patient not taking: Reported on 05/28/2017) 3 Tube 5 Not Taking at Unknown time  . hydrochlorothiazide (MICROZIDE) 12.5 MG capsule Take 1 capsule (12.5 mg total) by mouth daily. 30 capsule 1  Taking  . rOPINIRole (REQUIP) 0.5 MG tablet Take 1 tablet (0.5 mg total) by mouth at bedtime. 30 tablet 1 Taking  . senna (SENOKOT) 8.6 MG TABS Take 2 tablets (17.2 mg total) by mouth at bedtime. (Patient not taking: Reported on 05/28/2017) 120 each  Not Taking at Unknown time  . simvastatin (ZOCOR) 40 MG tablet Take 1 tablet (40 mg total) by mouth every evening. (Patient not taking: Reported on 05/28/2017) 30 tablet 1 Not Taking at Unknown time   Allergies  Allergen Reactions  . Lorazepam Other (See Comments)    "makes her crazy"  . Percocet [Oxycodone-Acetaminophen] Nausea And Vomiting    Social History   Tobacco Use  . Smoking status: Never Smoker  . Smokeless tobacco: Never Used  Substance Use Topics  . Alcohol use: No    Frequency: Never    Family History  Problem Relation Age of Onset  . Tuberculosis Mother   . COPD Father   . Coronary artery disease Father      Review of Systems  Constitutional: Negative.   HENT: Negative.   Eyes: Negative.   Respiratory: Negative.   Cardiovascular:       HTN  Gastrointestinal: Positive for constipation.  Genitourinary: Negative.   Musculoskeletal: Positive for joint pain.  Skin: Negative.   Neurological:       Poor balance  Endo/Heme/Allergies: Negative.   Psychiatric/Behavioral: Negative.     Objective:  Physical Exam  Constitutional: She is oriented to person, place, and time. She appears well-developed and well-nourished.  HENT:  Head: Normocephalic and atraumatic.  Eyes: Pupils are equal, round, and reactive to light.  Neck: Normal range of motion. Neck supple.  Cardiovascular: Intact distal pulses.  Respiratory: Effort normal.  Musculoskeletal:  She walks with a profound antalgic gait on the left side, any attempts at internal rotation left hip causes severe pain.  Internal rotation blocks at around 0.  External rotation is to maybe 20 or 30.  Foot tap is negative.    Neurological: She is alert and oriented to person,  place, and time.  Skin: Skin is warm and dry.  Psychiatric: She has a normal mood and affect. Her behavior is normal. Judgment and thought content normal.    Vital signs in last 24 hours: Temp:  [98 F (36.7 C)] 98 F (36.7 C) (01/15 1417) Pulse Rate:  [81] 81 (01/15 1417) BP: (108)/(60) 108/60 (01/15 1417) SpO2:  [96 %] 96 % (01/15 1417) Weight:  [72.3 kg (159 lb 4.8 oz)] 72.3 kg (159 lb 4.8 oz) (01/15 1417)  Labs:   Estimated body mass index is 29.14 kg/m as calculated from the following:   Height as of 06/03/17: 5\' 2"  (1.575 m).   Weight as of 06/03/17: 72.3 kg (159 lb 4.8 oz).   Imaging Review Plain radiographs demonstrate  End-stage arthritis of the left hip bone-on-bone with peripheral osteophytes  Assessment/Plan:  End stage arthritis, left hip(s)  The patient history, physical examination, clinical judgement of the provider and imaging studies are consistent with end stage degenerative joint disease of the left hip(s) and total hip arthroplasty is deemed medically necessary. The treatment options including medical management, injection therapy, arthroscopy and arthroplasty were discussed at length. The risks and benefits of total hip arthroplasty were presented and reviewed. The risks due to aseptic loosening, infection, stiffness, dislocation/subluxation,  thromboembolic complications and other imponderables were discussed.  The patient acknowledged the explanation, agreed to proceed with the plan and consent was signed. Patient is being admitted for inpatient treatment for surgery, pain control, PT, OT, prophylactic antibiotics, VTE prophylaxis, progressive ambulation and ADL's and discharge planning.The patient is planning to be discharged home with home health services.

## 2017-06-04 NOTE — Progress Notes (Signed)
Anesthesia Chart Review: Patient is a 84 year old female scheduled for left anterior hip arthroplasty on 06/09/17 by Dr. Frederik Pear.  History includes never smoker, HTN, hypercholesterolemia, RLS, GERD, left pontine CVA 06/16/11 (started on Plavix), right THA 07/12/10.  PCP is Dr. Greig Right is [place], [place].  Meds include Plavix (last dose 06/01/17), ASA 81 mg, amlodipine, Cymbalta, Neurontin, HCTZ, Prevacid, lisinopril, meclizine, Toprol-XL, Requip, Crestor, Zocor, tramadol.  EKG 06/03/17: NSR, inferior infarct (age undetermined). No previous EKG in Epic or Care Everywhere. Comparison EKG from [facility] from 07/17/12 received 06/05/17 and showed NSR. By 06/16/11 CVA H&P, EKG showed SR, tall R wave in V2, possibly positional versus possible posterior MI. HR was 63, Q wave lead III of questionable significance.  Echo 07/17/12 [facility]): Conclusions: 1.  Overall left ventricular systolic function is normal with EF between 60 and 65%. 2.  The diastolic filling pattern indicates impaired relaxation. 3.  There is mild aortic valve sclerosis without stenosis. 4.  There is mild aortic regurgitation. 5.  Mild tricuspid regurgitation present.  Carotid U/S 11/02/14 (copy found in PACS): Impression: 1.  Bilateral carotid atherosclerotic vascular disease right side greater than left.  No flow-limiting stenosis.  Similar findings noted on prior exam.  Stenosis less than 50%. 2.  Vertebral arteries are patent with antegrade flow.  CXR 06/03/17: IMPRESSION: Mild left basilar scarring versus atelectasis. Otherwise no active disease in the chest.  Preoperative labs noted. Cr 1.04. Glucose 117. CBC, PT/PTT WNL. T&S done. UA was negative for leukocytes, nitrites.   Patient with new inferior Q waves since 2014 EKG. She denied SOB, chest pain, cough, fever at PAT. Discussed with anesthesiologist Dr. Annye Asa. Recommend preoperative cardiology input. I have notified Juliann Pulse at Dr. Damita Dunnings office.    George Hugh [facility] Short Stay Center/Anesthesiology Phone [phone] 06/05/2017 5:56 PM

## 2017-06-06 ENCOUNTER — Encounter: Payer: Self-pay | Admitting: Cardiovascular Disease

## 2017-06-06 ENCOUNTER — Ambulatory Visit: Payer: PPO | Admitting: Cardiovascular Disease

## 2017-06-06 VITALS — BP 116/60 | HR 82 | Ht 62.0 in | Wt 160.4 lb

## 2017-06-06 DIAGNOSIS — Z01818 Encounter for other preprocedural examination: Secondary | ICD-10-CM

## 2017-06-06 DIAGNOSIS — I10 Essential (primary) hypertension: Secondary | ICD-10-CM | POA: Diagnosis not present

## 2017-06-06 DIAGNOSIS — I519 Heart disease, unspecified: Secondary | ICD-10-CM

## 2017-06-06 DIAGNOSIS — R9431 Abnormal electrocardiogram [ECG] [EKG]: Secondary | ICD-10-CM

## 2017-06-06 DIAGNOSIS — E785 Hyperlipidemia, unspecified: Secondary | ICD-10-CM | POA: Insufficient documentation

## 2017-06-06 DIAGNOSIS — E7800 Pure hypercholesterolemia, unspecified: Secondary | ICD-10-CM | POA: Diagnosis not present

## 2017-06-06 MED ORDER — BUPIVACAINE LIPOSOME 1.3 % IJ SUSP
20.0000 mL | Freq: Once | INTRAMUSCULAR | Status: DC
  Filled 2017-06-06: qty 20

## 2017-06-06 MED ORDER — TRANEXAMIC ACID 1000 MG/10ML IV SOLN
2000.0000 mg | INTRAVENOUS | Status: DC
  Filled 2017-06-06: qty 20

## 2017-06-06 MED ORDER — TRANEXAMIC ACID 1000 MG/10ML IV SOLN
1000.0000 mg | INTRAVENOUS | Status: DC
  Filled 2017-06-06: qty 10

## 2017-06-06 NOTE — Assessment & Plan Note (Signed)
Kimberly Lester has an EKG that was performed on 06/03/17 that showed inferior Q waves since her prior EKG 5 years ago. There is no history of intercurrent myocardial infarction. She had a normal 2-D echocardiogram performed 07/17/12. We will repeat a 2-D echo prior to clearing her for her upcoming elective left total hip replacement scheduled for Monday the 21st.

## 2017-06-06 NOTE — Patient Instructions (Signed)
Medication Instructions: Your physician recommends that you continue on your current medications as directed. Please refer to the Current Medication list given to you today.   Testing/Procedures: Your physician has requested that you have an echocardiogram on Monday 06/09/17 if possible for surgical clearance. Echocardiography is a painless test that uses sound waves to create images of your heart. It provides your doctor with information about the size and shape of your heart and how well your heart's chambers and valves are working. This procedure takes approximately one hour. There are no restrictions for this procedure.  Follow-Up: Your physician recommends that you schedule a follow-up appointment as needed with Dr. Gwenlyn Found if normal results. I will call you to let you know the results and about your clearance.     If you need a refill on your cardiac medications before your next appointment, please call your pharmacy.

## 2017-06-06 NOTE — Assessment & Plan Note (Signed)
History of essential hypertension blood pressure is 116/60. She is on amlodipine, hydrochlorothiazide, lisinopril and metoprolol. Continue current meds at current dosing.

## 2017-06-06 NOTE — Assessment & Plan Note (Signed)
History of hyperlipidemia on statin therapy followed by her PCP. 

## 2017-06-06 NOTE — Assessment & Plan Note (Signed)
Ms. Ivey is scheduled for elective left total hip replacement Monday 1/21/9. Preoperative evaluation revealed an EKG that showed new inferior Q waves compared to prior EKGs. She did have a normal 2-D echo back in 2014. We will repeat a 2-D echocardiogram.

## 2017-06-06 NOTE — Progress Notes (Signed)
06/06/2017 Kimberly Lester   [birth year]  [id number]  Primary Physician Greig Right, MD Primary Cardiologist: Lorretta Harp MD Lupe Carney, [place]  HPI:  Kimberly Lester is a 84 y.o. mildly overweight married Caucasian female mother of 3 children, grandmother 6 grandchildren and is accompanied by her daughter Juliann Pulse and her husband Mariea Clonts (Fat). She was referred by her orthopedic surgeon, Dr. Mayer Camel, for preoperative clearance prior to elective left total hip replacement scheduled for 06/09/17. She has a history of hypertension and hyperlipidemia. She has had strokes in the past but never has had a myocardial infarction. She denies chest pain or shortness of breath. A EKG performed on 06/03/17 showed inferior Q waves which appear to be new compared to her previous EKG several years ago. A 2-D echocardiogram performed 07/09/12 was essentially normal.   Current Meds  Medication Sig  . acetaminophen (TYLENOL) 500 MG tablet Take 500 mg by mouth every 6 (six) hours as needed for mild pain.   Marland Kitchen amLODipine (NORVASC) 5 MG tablet Take 5 mg by mouth daily.  Marland Kitchen aspirin EC 81 MG tablet Take 81 mg by mouth daily.  . clopidogrel (PLAVIX) 75 MG tablet Take 1 tablet (75 mg total) by mouth daily.  . DULoxetine (CYMBALTA) 30 MG capsule Take 30 mg by mouth 2 (two) times daily.  Marland Kitchen gabapentin (NEURONTIN) 100 MG capsule Take 200 mg by mouth at bedtime.  . hydrochlorothiazide (HYDRODIURIL) 25 MG tablet Take 25 mg by mouth daily.  . lansoprazole (PREVACID) 30 MG capsule Take 30 mg by mouth daily at 12 noon.  Marland Kitchen lisinopril (PRINIVIL,ZESTRIL) 20 MG tablet Take a whole pill in am and half a pill in pm. (Patient taking differently: Take 20 mg by mouth daily. )  . meclizine (ANTIVERT) 25 MG tablet Take 25 mg by mouth 3 (three) times daily as needed for dizziness.   Marland Kitchen methyl salicylate-menthol (BENGAY) ointment Apply 1 application topically as needed for pain. For muscle/joint pain   . metoprolol succinate (TOPROL-XL) 50  MG 24 hr tablet Take 1 tablet (50 mg total) by mouth 2 (two) times daily. Take with or immediately following a meal. (Patient taking differently: Take 50-100 mg by mouth 2 (two) times daily. Take 1 tablet in the morning and 2 tablets at night)  . psyllium (METAMUCIL SMOOTH TEXTURE) 28 % packet Take 1 packet by mouth daily.  Marland Kitchen rOPINIRole (REQUIP) 1 MG tablet Take 2 mg by mouth at bedtime.  . rosuvastatin (CRESTOR) 40 MG tablet Take 40 mg by mouth every evening.  . traMADol (ULTRAM) 50 MG tablet Take 50 mg by mouth every 6 (six) hours as needed for moderate pain.      Allergies  Allergen Reactions  . Lorazepam Other (See Comments)    "makes her crazy"  . Percocet [Oxycodone-Acetaminophen] Nausea And Vomiting    Social History   Socioeconomic History  . Marital status: Married    Spouse name: Not on file  . Number of children: Not on file  . Years of education: Not on file  . Highest education level: Not on file  Social Needs  . Financial resource strain: Not on file  . Food insecurity - worry: Not on file  . Food insecurity - inability: Not on file  . Transportation needs - medical: Not on file  . Transportation needs - non-medical: Not on file  Occupational History  . Not on file  Tobacco Use  . Smoking status: Never Smoker  . Smokeless tobacco: Never  Used  Substance and Sexual Activity  . Alcohol use: No    Frequency: Never  . Drug use: No  . Sexual activity: Not on file  Other Topics Concern  . Not on file  Social History Narrative  . Not on file     Review of Systems: General: negative for chills, fever, night sweats or weight changes.  Cardiovascular: negative for chest pain, dyspnea on exertion, edema, orthopnea, palpitations, paroxysmal nocturnal dyspnea or shortness of breath Dermatological: negative for rash Respiratory: negative for cough or wheezing Urologic: negative for hematuria Abdominal: negative for nausea, vomiting, diarrhea, bright red blood per  rectum, melena, or hematemesis Neurologic: negative for visual changes, syncope, or dizziness All other systems reviewed and are otherwise negative except as noted above.    Blood pressure 116/60, pulse 82, height 5\' 2"  (1.575 m), weight 160 lb 6.4 oz (72.8 kg).  General appearance: alert and no distress Neck: no adenopathy, no carotid bruit, no JVD, supple, symmetrical, trachea midline and thyroid not enlarged, symmetric, no tenderness/mass/nodules Lungs: clear to auscultation bilaterally Heart: regular rate and rhythm, S1, S2 normal, no murmur, click, rub or gallop Extremities: extremities normal, atraumatic, no cyanosis or edema Pulses: 2+ and symmetric Skin: Skin color, texture, turgor normal. No rashes or lesions Neurologic: Alert and oriented X 3, normal strength and tone. Normal symmetric reflexes. Normal coordination and gait  EKG not performed today  ASSESSMENT AND PLAN:   Essential hypertension History of essential hypertension blood pressure is 116/60. She is on amlodipine, hydrochlorothiazide, lisinopril and metoprolol. Continue current meds at current dosing.  Hyperlipidemia History of hyperlipidemia on statin therapy followed by her PCP  Abnormal EKG Ms. Geary has an EKG that was performed on 06/03/17 that showed inferior Q waves since her prior EKG 5 years ago. There is no history of intercurrent myocardial infarction. She had a normal 2-D echocardiogram performed 07/17/12. We will repeat a 2-D echo prior to clearing her for her upcoming elective left total hip replacement scheduled for Monday the 21st.  Preoperative clearance Ms. Haller is scheduled for elective left total hip replacement Monday 1/21/9. Preoperative evaluation revealed an EKG that showed new inferior Q waves compared to prior EKGs. She did have a normal 2-D echo back in 2014. We will repeat a 2-D echocardiogram.      Lorretta Harp MD [facility], [facility] 06/06/2017 2:47 PM

## 2017-06-09 ENCOUNTER — Inpatient Hospital Stay (HOSPITAL_COMMUNITY): Admission: RE | Admit: 2017-06-09 | Payer: PPO | Source: Ambulatory Visit | Admitting: Orthopedic Surgery

## 2017-06-09 ENCOUNTER — Encounter (HOSPITAL_COMMUNITY): Admission: RE | Payer: Self-pay | Source: Ambulatory Visit

## 2017-06-09 SURGERY — ARTHROPLASTY, HIP, TOTAL, ANTERIOR APPROACH
Anesthesia: Spinal | Laterality: Left

## 2017-06-10 ENCOUNTER — Ambulatory Visit (HOSPITAL_COMMUNITY): Payer: PPO | Attending: Cardiology

## 2017-06-10 ENCOUNTER — Other Ambulatory Visit: Payer: Self-pay

## 2017-06-10 DIAGNOSIS — Z01818 Encounter for other preprocedural examination: Secondary | ICD-10-CM | POA: Diagnosis not present

## 2017-06-10 DIAGNOSIS — E785 Hyperlipidemia, unspecified: Secondary | ICD-10-CM | POA: Insufficient documentation

## 2017-06-10 DIAGNOSIS — I119 Hypertensive heart disease without heart failure: Secondary | ICD-10-CM | POA: Insufficient documentation

## 2017-06-10 DIAGNOSIS — R9431 Abnormal electrocardiogram [ECG] [EKG]: Secondary | ICD-10-CM | POA: Diagnosis not present

## 2017-06-10 DIAGNOSIS — I519 Heart disease, unspecified: Secondary | ICD-10-CM | POA: Diagnosis not present

## 2017-06-10 DIAGNOSIS — Z8249 Family history of ischemic heart disease and other diseases of the circulatory system: Secondary | ICD-10-CM | POA: Diagnosis not present

## 2017-06-13 ENCOUNTER — Telehealth: Payer: Self-pay | Admitting: Cardiovascular Disease

## 2017-06-13 NOTE — Telephone Encounter (Signed)
New message   Patient is calling in to check on the status of her Echo results. Please call

## 2017-06-13 NOTE — Telephone Encounter (Signed)
Call and spoke with pt letting her know Dr Gwenlyn Found has not reviewed her Echo and she will be in office next Tuesday and his primary nurse will call when Echo is reviewed.

## 2017-06-19 ENCOUNTER — Telehealth: Payer: Self-pay | Admitting: Cardiovascular Disease

## 2017-06-19 ENCOUNTER — Other Ambulatory Visit: Payer: Self-pay | Admitting: Orthopedic Surgery

## 2017-06-19 NOTE — Telephone Encounter (Signed)
Spoke with pt, aware of results and results faxed to dr frank rowan at [facility].

## 2017-06-19 NOTE — Telephone Encounter (Signed)
New message ° ° ° °Patient calling for echo results. Please call °

## 2017-06-20 NOTE — Progress Notes (Addendum)
Anesthesia Follow-up: Patient is an 84 year old female scheduled for left anterior hip arthroplasty on 06/25/17 by Dr. Frederik Pear. Procedure was initially scheduled for 06/09/17, but was postponed for cardiology evaluation due to new inferior Q waves. Since then patient has been evaluated by Dr. Quay Burow on 06/06/17. He recommended an echocardiogram that showed normal LVF, and she was cleared for upcoming surgery.   History includes never smoker, HTN, hypercholesterolemia, RLS, GERD, left pontine CVA 06/16/11 (started on Plavix), right THA 07/12/10.  PCP is Dr. Greig Right is [place], [place].  Meds include Plavix, ASA 81 mg, amlodipine, Cymbalta, Neurontin, HCTZ, Prevacid, lisinopril, meclizine, Toprol-XL, Requip, Crestor, Zocor, tramadol. Juliann Pulse from Dr. Damita Dunnings office called on 06/19/17--the day surgery was re-posted. She was going to instruct patient to hold Plavix that day if patient was not already doing so.)  EKG 06/03/17: NSR, inferior infarct (age undetermined). No previous EKG in Epic or Care Everywhere. Comparison EKG from [facility] from 07/17/12 received and showed NSR.   Echo 06/10/17: Study Conclusions - Left ventricle: The cavity size was normal. Wall thickness was   normal. Systolic function was vigorous. The estimated ejection   fraction was in the range of 65% to 70%. Wall motion was normal;   there were no regional wall motion abnormalities. Doppler   parameters are consistent with abnormal left ventricular   relaxation (grade 1 diastolic dysfunction). - Aortic valve: There was trivial regurgitation. - Mitral valve: Calcified annulus. Valve area by continuity   equation (using LVOT flow): 3.27 cm^2.  Carotid U/S 11/02/14 (copy found in PACS): Impression: 1.  Bilateral carotid atherosclerotic vascular disease right side greater than left.  No flow-limiting stenosis.  Similar findings noted on prior exam.  Stenosis less than 50%. 2.  Vertebral arteries are patent with  antegrade flow.  CXR 06/03/17: IMPRESSION: Mild left basilar scarring versus atelectasis. Otherwise no active disease in the chest.  Labs from 06/03/17 reviewed. Cr 1.04. Glucose 117. CBC, PT/PTT WNL. T&S done. UA was negative for leukocytes, nitrites. T&S is now expired. Labs are also > 80 days old, so I'll see if our PAT scheduler is able to get patient to come in to PAT between now and surgery to update her labs. If patient unable to do so, then she will need repeat labs on the day of surgery. (Update 06/23/17 3:27 PM: Labs from 06/23/17 reviewed.)  Myra Gianotti, PA-C [facility] Short Stay Center/Anesthesiology Phone [phone] 06/20/2017 3:16 PM

## 2017-06-23 ENCOUNTER — Encounter (HOSPITAL_COMMUNITY)
Admission: RE | Admit: 2017-06-23 | Discharge: 2017-06-23 | Disposition: A | Discharge status: Home / Self Care | Payer: PPO | Source: Ambulatory Visit | Attending: Orthopedic Surgery | Admitting: Orthopedic Surgery

## 2017-06-23 DIAGNOSIS — K5900 Constipation, unspecified: Secondary | ICD-10-CM | POA: Diagnosis not present

## 2017-06-23 DIAGNOSIS — E785 Hyperlipidemia, unspecified: Secondary | ICD-10-CM | POA: Diagnosis not present

## 2017-06-23 DIAGNOSIS — Z79891 Long term (current) use of opiate analgesic: Secondary | ICD-10-CM | POA: Diagnosis not present

## 2017-06-23 DIAGNOSIS — M25552 Pain in left hip: Secondary | ICD-10-CM | POA: Diagnosis present

## 2017-06-23 DIAGNOSIS — Z471 Aftercare following joint replacement surgery: Secondary | ICD-10-CM | POA: Diagnosis not present

## 2017-06-23 DIAGNOSIS — E7800 Pure hypercholesterolemia, unspecified: Secondary | ICD-10-CM | POA: Diagnosis not present

## 2017-06-23 DIAGNOSIS — M25752 Osteophyte, left hip: Secondary | ICD-10-CM | POA: Diagnosis not present

## 2017-06-23 DIAGNOSIS — Z7982 Long term (current) use of aspirin: Secondary | ICD-10-CM | POA: Diagnosis not present

## 2017-06-23 DIAGNOSIS — Z8249 Family history of ischemic heart disease and other diseases of the circulatory system: Secondary | ICD-10-CM | POA: Diagnosis not present

## 2017-06-23 DIAGNOSIS — Z8673 Personal history of transient ischemic attack (TIA), and cerebral infarction without residual deficits: Secondary | ICD-10-CM | POA: Diagnosis not present

## 2017-06-23 DIAGNOSIS — Z96642 Presence of left artificial hip joint: Secondary | ICD-10-CM | POA: Diagnosis not present

## 2017-06-23 DIAGNOSIS — M1612 Unilateral primary osteoarthritis, left hip: Secondary | ICD-10-CM | POA: Diagnosis not present

## 2017-06-23 DIAGNOSIS — Z825 Family history of asthma and other chronic lower respiratory diseases: Secondary | ICD-10-CM | POA: Diagnosis not present

## 2017-06-23 DIAGNOSIS — I10 Essential (primary) hypertension: Secondary | ICD-10-CM | POA: Diagnosis not present

## 2017-06-23 DIAGNOSIS — G2581 Restless legs syndrome: Secondary | ICD-10-CM | POA: Diagnosis not present

## 2017-06-23 DIAGNOSIS — D62 Acute posthemorrhagic anemia: Secondary | ICD-10-CM | POA: Diagnosis not present

## 2017-06-23 DIAGNOSIS — M19049 Primary osteoarthritis, unspecified hand: Secondary | ICD-10-CM | POA: Diagnosis not present

## 2017-06-23 DIAGNOSIS — Z79899 Other long term (current) drug therapy: Secondary | ICD-10-CM | POA: Diagnosis not present

## 2017-06-23 DIAGNOSIS — K219 Gastro-esophageal reflux disease without esophagitis: Secondary | ICD-10-CM | POA: Diagnosis not present

## 2017-06-23 DIAGNOSIS — Z7902 Long term (current) use of antithrombotics/antiplatelets: Secondary | ICD-10-CM | POA: Diagnosis not present

## 2017-06-23 DIAGNOSIS — Z888 Allergy status to other drugs, medicaments and biological substances status: Secondary | ICD-10-CM | POA: Diagnosis not present

## 2017-06-23 LAB — CBC WITH DIFFERENTIAL/PLATELET
BASOS ABS: 0 10*3/uL (ref 0.0–0.1)
BASOS PCT: 0 %
Eosinophils Absolute: 0.2 10*3/uL (ref 0.0–0.7)
Eosinophils Relative: 3 %
HCT: 38.6 % (ref 36.0–46.0)
HEMOGLOBIN: 12.5 g/dL (ref 12.0–15.0)
Lymphocytes Relative: 25 %
Lymphs Abs: 1.5 10*3/uL (ref 0.7–4.0)
MCH: 29 pg (ref 26.0–34.0)
MCHC: 32.4 g/dL (ref 30.0–36.0)
MCV: 89.6 fL (ref 78.0–100.0)
Monocytes Absolute: 0.4 10*3/uL (ref 0.1–1.0)
Monocytes Relative: 6 %
NEUTROS ABS: 4 10*3/uL (ref 1.7–7.7)
NEUTROS PCT: 66 %
Platelets: 211 10*3/uL (ref 150–400)
RBC: 4.31 MIL/uL (ref 3.87–5.11)
RDW: 14 % (ref 11.5–15.5)
WBC: 6 10*3/uL (ref 4.0–10.5)

## 2017-06-23 LAB — BASIC METABOLIC PANEL
ANION GAP: 11 (ref 5–15)
BUN: 28 mg/dL — ABNORMAL HIGH (ref 6–20)
CALCIUM: 9.3 mg/dL (ref 8.9–10.3)
CO2: 23 mmol/L (ref 22–32)
Chloride: 104 mmol/L (ref 101–111)
Creatinine, Ser: 1.27 mg/dL — ABNORMAL HIGH (ref 0.44–1.00)
GFR, EST AFRICAN AMERICAN: 44 mL/min — AB (ref >60)
GFR, EST NON AFRICAN AMERICAN: 38 mL/min — AB (ref >60)
Glucose, Bld: 91 mg/dL (ref 65–99)
POTASSIUM: 3.9 mmol/L (ref 3.5–5.1)
Sodium: 138 mmol/L (ref 135–145)

## 2017-06-23 LAB — URINALYSIS, ROUTINE W REFLEX MICROSCOPIC
Bilirubin Urine: NEGATIVE
Glucose, UA: NEGATIVE mg/dL
HGB URINE DIPSTICK: NEGATIVE
KETONES UR: NEGATIVE mg/dL
Leukocytes, UA: NEGATIVE
Nitrite: NEGATIVE
PROTEIN: NEGATIVE mg/dL
Specific Gravity, Urine: 1.026 (ref 1.005–1.030)
pH: 5 (ref 5.0–8.0)

## 2017-06-23 LAB — TYPE AND SCREEN
ABO/RH(D): A NEG
ANTIBODY SCREEN: NEGATIVE

## 2017-06-23 LAB — PROTIME-INR
INR: 1.01
Prothrombin Time: 13.2 seconds (ref 11.4–15.2)

## 2017-06-23 LAB — APTT: APTT: 32 s (ref 24–36)

## 2017-06-24 MED ORDER — SODIUM CHLORIDE 0.9 % IV SOLN
2000.0000 mg | INTRAVENOUS | Status: AC
  Administered 2017-06-25: 2000 mg via TOPICAL
  Filled 2017-06-24: qty 20

## 2017-06-24 NOTE — Progress Notes (Signed)
Unable to reach pt for pre-op call. Pt had recent PAT for same procedure, it got rescheduled. Pt came in yesterday and her blood work drawn. Left pre-op instructions on pt's voicemail

## 2017-06-25 ENCOUNTER — Inpatient Hospital Stay (HOSPITAL_COMMUNITY): Payer: PPO

## 2017-06-25 ENCOUNTER — Other Ambulatory Visit: Payer: Self-pay

## 2017-06-25 ENCOUNTER — Inpatient Hospital Stay (HOSPITAL_COMMUNITY): Payer: PPO | Admitting: Vascular Surgery

## 2017-06-25 ENCOUNTER — Encounter (HOSPITAL_COMMUNITY): Payer: Self-pay | Admitting: Certified Registered"

## 2017-06-25 ENCOUNTER — Encounter (HOSPITAL_COMMUNITY)
Admission: RE | Disposition: A | Discharge status: Home Health | Payer: Self-pay | Source: Ambulatory Visit | Attending: Orthopedic Surgery

## 2017-06-25 ENCOUNTER — Inpatient Hospital Stay (HOSPITAL_COMMUNITY)
Admission: RE | Admit: 2017-06-25 | Discharge: 2017-06-26 | DRG: 470 | Disposition: A | Discharge status: Home Health | Payer: PPO | Source: Ambulatory Visit | Attending: Orthopedic Surgery | Admitting: Orthopedic Surgery

## 2017-06-25 DIAGNOSIS — Z79891 Long term (current) use of opiate analgesic: Secondary | ICD-10-CM

## 2017-06-25 DIAGNOSIS — G2581 Restless legs syndrome: Secondary | ICD-10-CM | POA: Diagnosis present

## 2017-06-25 DIAGNOSIS — M25552 Pain in left hip: Secondary | ICD-10-CM | POA: Diagnosis present

## 2017-06-25 DIAGNOSIS — Z8249 Family history of ischemic heart disease and other diseases of the circulatory system: Secondary | ICD-10-CM

## 2017-06-25 DIAGNOSIS — Z825 Family history of asthma and other chronic lower respiratory diseases: Secondary | ICD-10-CM

## 2017-06-25 DIAGNOSIS — Z7982 Long term (current) use of aspirin: Secondary | ICD-10-CM | POA: Diagnosis not present

## 2017-06-25 DIAGNOSIS — K5900 Constipation, unspecified: Secondary | ICD-10-CM | POA: Diagnosis present

## 2017-06-25 DIAGNOSIS — Z888 Allergy status to other drugs, medicaments and biological substances status: Secondary | ICD-10-CM

## 2017-06-25 DIAGNOSIS — E7800 Pure hypercholesterolemia, unspecified: Secondary | ICD-10-CM | POA: Diagnosis present

## 2017-06-25 DIAGNOSIS — D62 Acute posthemorrhagic anemia: Secondary | ICD-10-CM | POA: Diagnosis not present

## 2017-06-25 DIAGNOSIS — M19049 Primary osteoarthritis, unspecified hand: Secondary | ICD-10-CM | POA: Diagnosis present

## 2017-06-25 DIAGNOSIS — M25752 Osteophyte, left hip: Secondary | ICD-10-CM | POA: Diagnosis present

## 2017-06-25 DIAGNOSIS — Z8673 Personal history of transient ischemic attack (TIA), and cerebral infarction without residual deficits: Secondary | ICD-10-CM

## 2017-06-25 DIAGNOSIS — Z7902 Long term (current) use of antithrombotics/antiplatelets: Secondary | ICD-10-CM

## 2017-06-25 DIAGNOSIS — I10 Essential (primary) hypertension: Secondary | ICD-10-CM | POA: Diagnosis present

## 2017-06-25 DIAGNOSIS — K219 Gastro-esophageal reflux disease without esophagitis: Secondary | ICD-10-CM | POA: Diagnosis present

## 2017-06-25 DIAGNOSIS — Z419 Encounter for procedure for purposes other than remedying health state, unspecified: Secondary | ICD-10-CM

## 2017-06-25 DIAGNOSIS — M1612 Unilateral primary osteoarthritis, left hip: Secondary | ICD-10-CM | POA: Diagnosis present

## 2017-06-25 DIAGNOSIS — Z79899 Other long term (current) drug therapy: Secondary | ICD-10-CM | POA: Diagnosis not present

## 2017-06-25 HISTORY — PX: TOTAL HIP ARTHROPLASTY: SHX124

## 2017-06-25 SURGERY — ARTHROPLASTY, HIP, TOTAL, ANTERIOR APPROACH
Anesthesia: Spinal | Laterality: Left

## 2017-06-25 MED ORDER — TRANEXAMIC ACID 1000 MG/10ML IV SOLN
1000.0000 mg | Freq: Once | INTRAVENOUS | Status: AC
  Administered 2017-06-25: 1000 mg via INTRAVENOUS
  Filled 2017-06-25: qty 10

## 2017-06-25 MED ORDER — DEXTROSE 5 % IV SOLN: 500.0000 mg | Freq: Four times a day (QID) | INTRAVENOUS | Status: DC | PRN

## 2017-06-25 MED ORDER — MENTHOL 3 MG MT LOZG: 1.0000 | LOZENGE | OROMUCOSAL | Status: DC | PRN

## 2017-06-25 MED ORDER — METOCLOPRAMIDE HCL 5 MG/ML IJ SOLN: 5.0000 mg | Freq: Three times a day (TID) | INTRAMUSCULAR | Status: DC | PRN

## 2017-06-25 MED ORDER — DULOXETINE HCL 30 MG PO CPEP
30.0000 mg | ORAL_CAPSULE | Freq: Two times a day (BID) | ORAL | Status: DC
  Administered 2017-06-26: 30 mg via ORAL
  Filled 2017-06-25: qty 1

## 2017-06-25 MED ORDER — DEXAMETHASONE SODIUM PHOSPHATE 10 MG/ML IJ SOLN
10.0000 mg | Freq: Once | INTRAMUSCULAR | Status: AC
  Administered 2017-06-26: 10 mg via INTRAVENOUS
  Filled 2017-06-25: qty 1

## 2017-06-25 MED ORDER — ALBUMIN HUMAN 5 % IV SOLN
INTRAVENOUS | Status: DC | PRN
  Administered 2017-06-25: 13:00:00 via INTRAVENOUS

## 2017-06-25 MED ORDER — DIPHENHYDRAMINE HCL 50 MG/ML IJ SOLN
INTRAMUSCULAR | Status: DC | PRN
  Administered 2017-06-25: 12.5 mg via INTRAVENOUS

## 2017-06-25 MED ORDER — DEXAMETHASONE SODIUM PHOSPHATE 10 MG/ML IJ SOLN
INTRAMUSCULAR | Status: DC | PRN
  Administered 2017-06-25: 4 mg via INTRAVENOUS

## 2017-06-25 MED ORDER — HYDROCODONE-ACETAMINOPHEN 5-325 MG PO TABS
1.0000 | ORAL_TABLET | ORAL | Status: DC | PRN
  Filled 2017-06-25: qty 1

## 2017-06-25 MED ORDER — METHOCARBAMOL 500 MG PO TABS
500.0000 mg | ORAL_TABLET | Freq: Four times a day (QID) | ORAL | Status: DC | PRN
  Administered 2017-06-25 – 2017-06-26 (×4): 500 mg via ORAL
  Filled 2017-06-25 (×4): qty 1

## 2017-06-25 MED ORDER — MECLIZINE HCL 25 MG PO TABS
25.0000 mg | ORAL_TABLET | Freq: Three times a day (TID) | ORAL | Status: DC | PRN
Start: 2017-06-25 — End: 2017-06-26

## 2017-06-25 MED ORDER — AMLODIPINE BESYLATE 5 MG PO TABS
5.0000 mg | ORAL_TABLET | Freq: Every day | ORAL | Status: DC
  Administered 2017-06-26: 5 mg via ORAL
  Filled 2017-06-25: qty 1

## 2017-06-25 MED ORDER — ACETAMINOPHEN 325 MG PO TABS: 650.0000 mg | ORAL_TABLET | ORAL | Status: DC | PRN

## 2017-06-25 MED ORDER — METOPROLOL SUCCINATE ER 50 MG PO TB24: 50.0000 mg | ORAL_TABLET | Freq: Two times a day (BID) | ORAL | Status: DC

## 2017-06-25 MED ORDER — DIPHENHYDRAMINE HCL 12.5 MG/5ML PO ELIX: 12.5000 mg | ORAL_SOLUTION | ORAL | Status: DC | PRN

## 2017-06-25 MED ORDER — BUPIVACAINE IN DEXTROSE 0.75-8.25 % IT SOLN
INTRATHECAL | Status: DC | PRN
  Administered 2017-06-25: 2 mL via INTRATHECAL

## 2017-06-25 MED ORDER — ASPIRIN EC 325 MG PO TBEC
325.0000 mg | DELAYED_RELEASE_TABLET | Freq: Every day | ORAL | Status: DC
  Administered 2017-06-26: 325 mg via ORAL
  Filled 2017-06-25: qty 1

## 2017-06-25 MED ORDER — HYDROMORPHONE HCL 1 MG/ML IJ SOLN: 0.2500 mg | INTRAMUSCULAR | Status: DC | PRN

## 2017-06-25 MED ORDER — PANTOPRAZOLE SODIUM 40 MG PO TBEC
40.0000 mg | DELAYED_RELEASE_TABLET | Freq: Every day | ORAL | Status: DC
  Administered 2017-06-26: 40 mg via ORAL
  Filled 2017-06-25: qty 1

## 2017-06-25 MED ORDER — PROPOFOL 10 MG/ML IV BOLUS
INTRAVENOUS | Status: DC | PRN
  Administered 2017-06-25: 10 mg via INTRAVENOUS
  Administered 2017-06-25: 20 mg via INTRAVENOUS
  Administered 2017-06-25: 10 mg via INTRAVENOUS
  Administered 2017-06-25: 20 mg via INTRAVENOUS

## 2017-06-25 MED ORDER — BUPIVACAINE-EPINEPHRINE (PF) 0.5% -1:200000 IJ SOLN
INTRAMUSCULAR | Status: DC | PRN
  Administered 2017-06-25: 50 mL

## 2017-06-25 MED ORDER — 0.9 % SODIUM CHLORIDE (POUR BTL) OPTIME
TOPICAL | Status: DC | PRN
  Administered 2017-06-25: 1000 mL

## 2017-06-25 MED ORDER — LISINOPRIL 20 MG PO TABS
20.0000 mg | ORAL_TABLET | Freq: Every day | ORAL | Status: DC
  Administered 2017-06-26: 20 mg via ORAL
  Filled 2017-06-25: qty 1

## 2017-06-25 MED ORDER — ONDANSETRON HCL 4 MG/2ML IJ SOLN
INTRAMUSCULAR | Status: DC | PRN
  Administered 2017-06-25: 4 mg via INTRAVENOUS

## 2017-06-25 MED ORDER — ASPIRIN EC 325 MG PO TBEC: 325.0000 mg | DELAYED_RELEASE_TABLET | Freq: Two times a day (BID) | ORAL | 0 refills | Status: DC

## 2017-06-25 MED ORDER — BISACODYL 5 MG PO TBEC
5.0000 mg | DELAYED_RELEASE_TABLET | Freq: Every day | ORAL | Status: DC | PRN
Start: 2017-06-25 — End: 2017-06-26

## 2017-06-25 MED ORDER — CHLORHEXIDINE GLUCONATE 4 % EX LIQD: 60.0000 mL | Freq: Once | CUTANEOUS | Status: DC

## 2017-06-25 MED ORDER — PHENYLEPHRINE HCL 10 MG/ML IJ SOLN
INTRAMUSCULAR | Status: DC | PRN
  Administered 2017-06-25: 40 ug via INTRAVENOUS

## 2017-06-25 MED ORDER — ACETAMINOPHEN 650 MG RE SUPP: 650.0000 mg | RECTAL | Status: DC | PRN

## 2017-06-25 MED ORDER — LACTATED RINGERS IV SOLN
INTRAVENOUS | Status: DC
  Administered 2017-06-25: 10:00:00 via INTRAVENOUS

## 2017-06-25 MED ORDER — DOCUSATE SODIUM 100 MG PO CAPS
100.0000 mg | ORAL_CAPSULE | Freq: Two times a day (BID) | ORAL | Status: DC
  Administered 2017-06-26: 100 mg via ORAL
  Filled 2017-06-25: qty 1

## 2017-06-25 MED ORDER — HYDROCODONE-ACETAMINOPHEN 10-325 MG PO TABS
2.0000 | ORAL_TABLET | ORAL | Status: DC | PRN
  Administered 2017-06-25 – 2017-06-26 (×5): 2 via ORAL
  Filled 2017-06-25 (×5): qty 2

## 2017-06-25 MED ORDER — BUPIVACAINE LIPOSOME 1.3 % IJ SUSP
20.0000 mL | INTRAMUSCULAR | Status: AC
  Administered 2017-06-25: 20 mL
  Filled 2017-06-25: qty 20

## 2017-06-25 MED ORDER — HYDROMORPHONE HCL 1 MG/ML IJ SOLN: 0.5000 mg | INTRAMUSCULAR | Status: DC | PRN

## 2017-06-25 MED ORDER — BUPIVACAINE-EPINEPHRINE (PF) 0.5% -1:200000 IJ SOLN
INTRAMUSCULAR | Status: AC
  Filled 2017-06-25: qty 30

## 2017-06-25 MED ORDER — METOCLOPRAMIDE HCL 5 MG PO TABS: 5.0000 mg | ORAL_TABLET | Freq: Three times a day (TID) | ORAL | Status: DC | PRN

## 2017-06-25 MED ORDER — METOPROLOL SUCCINATE ER 50 MG PO TB24
50.0000 mg | ORAL_TABLET | Freq: Every day | ORAL | Status: DC
  Administered 2017-06-26: 50 mg via ORAL
  Filled 2017-06-25: qty 1

## 2017-06-25 MED ORDER — HYDROCODONE-ACETAMINOPHEN 5-325 MG PO TABS: 1.0000 | ORAL_TABLET | Freq: Four times a day (QID) | ORAL | 0 refills | Status: DC | PRN

## 2017-06-25 MED ORDER — FLEET ENEMA 7-19 GM/118ML RE ENEM: 1.0000 | ENEMA | Freq: Once | RECTAL | Status: DC | PRN

## 2017-06-25 MED ORDER — POLYETHYLENE GLYCOL 3350 17 G PO PACK: 17.0000 g | PACK | Freq: Every day | ORAL | Status: DC | PRN

## 2017-06-25 MED ORDER — GLYCOPYRROLATE 0.2 MG/ML IJ SOLN
INTRAMUSCULAR | Status: DC | PRN
  Administered 2017-06-25: 0.2 mg via INTRAVENOUS

## 2017-06-25 MED ORDER — ONDANSETRON HCL 4 MG/2ML IJ SOLN: 4.0000 mg | Freq: Four times a day (QID) | INTRAMUSCULAR | Status: DC | PRN

## 2017-06-25 MED ORDER — HYDROCHLOROTHIAZIDE 25 MG PO TABS
25.0000 mg | ORAL_TABLET | Freq: Every day | ORAL | Status: DC
  Administered 2017-06-26: 25 mg via ORAL
  Filled 2017-06-25: qty 1

## 2017-06-25 MED ORDER — KCL IN DEXTROSE-NACL 20-5-0.45 MEQ/L-%-% IV SOLN
INTRAVENOUS | Status: DC
  Administered 2017-06-25: 16:00:00 via INTRAVENOUS
  Filled 2017-06-25: qty 1000

## 2017-06-25 MED ORDER — METOPROLOL SUCCINATE ER 100 MG PO TB24
100.0000 mg | ORAL_TABLET | Freq: Every day | ORAL | Status: DC
  Filled 2017-06-25: qty 1

## 2017-06-25 MED ORDER — ONDANSETRON HCL 4 MG PO TABS: 4.0000 mg | ORAL_TABLET | Freq: Four times a day (QID) | ORAL | Status: DC | PRN

## 2017-06-25 MED ORDER — PROMETHAZINE HCL 25 MG/ML IJ SOLN: 6.2500 mg | INTRAMUSCULAR | Status: DC | PRN

## 2017-06-25 MED ORDER — GABAPENTIN 100 MG PO CAPS
200.0000 mg | ORAL_CAPSULE | Freq: Every day | ORAL | Status: DC
  Administered 2017-06-25: 200 mg via ORAL
  Filled 2017-06-25 (×2): qty 2

## 2017-06-25 MED ORDER — CLOPIDOGREL BISULFATE 75 MG PO TABS
75.0000 mg | ORAL_TABLET | Freq: Every day | ORAL | Status: DC
  Administered 2017-06-26: 75 mg via ORAL
  Filled 2017-06-25: qty 1

## 2017-06-25 MED ORDER — CEFAZOLIN SODIUM-DEXTROSE 2-4 GM/100ML-% IV SOLN
2.0000 g | INTRAVENOUS | Status: AC
  Administered 2017-06-25: 2 g via INTRAVENOUS
  Filled 2017-06-25: qty 100

## 2017-06-25 MED ORDER — PHENOL 1.4 % MT LIQD: 1.0000 | OROMUCOSAL | Status: DC | PRN

## 2017-06-25 MED ORDER — ROPINIROLE HCL 1 MG PO TABS
2.0000 mg | ORAL_TABLET | Freq: Every day | ORAL | Status: DC
  Administered 2017-06-25: 2 mg via ORAL
  Filled 2017-06-25: qty 2

## 2017-06-25 MED ORDER — PROPOFOL 500 MG/50ML IV EMUL
INTRAVENOUS | Status: DC | PRN
  Administered 2017-06-25: 30 ug/kg/min via INTRAVENOUS

## 2017-06-25 MED ORDER — PHENYLEPHRINE HCL 10 MG/ML IJ SOLN
INTRAVENOUS | Status: DC | PRN
  Administered 2017-06-25: 20 ug/min via INTRAVENOUS

## 2017-06-25 MED ORDER — TIZANIDINE HCL 2 MG PO TABS: 2.0000 mg | ORAL_TABLET | Freq: Four times a day (QID) | ORAL | 0 refills | Status: DC | PRN

## 2017-06-25 MED ORDER — ALUMINUM HYDROXIDE GEL 320 MG/5ML PO SUSP: 15.0000 mL | ORAL | Status: DC | PRN

## 2017-06-25 SURGICAL SUPPLY — 45 items
BAG DECANTER FOR FLEXI CONT (MISCELLANEOUS) ×3 IMPLANT
BLADE SAW SGTL 18X1.27X75 (BLADE) ×2 IMPLANT
BLADE SAW SGTL 18X1.27X75MM (BLADE) ×1
CAPT HIP TOTAL 2 ×3 IMPLANT
COVER PERINEAL POST (MISCELLANEOUS) ×3 IMPLANT
COVER SURGICAL LIGHT HANDLE (MISCELLANEOUS) ×3 IMPLANT
DRAPE C-ARM 42X72 X-RAY (DRAPES) ×3 IMPLANT
DRAPE STERI IOBAN 125X83 (DRAPES) ×3 IMPLANT
DRAPE U-SHAPE 47X51 STRL (DRAPES) ×6 IMPLANT
DRSG AQUACEL AG ADV 3.5X10 (GAUZE/BANDAGES/DRESSINGS) ×3 IMPLANT
DURAPREP 26ML APPLICATOR (WOUND CARE) ×3 IMPLANT
ELECT BLADE 4.0 EZ CLEAN MEGAD (MISCELLANEOUS) ×3
ELECT REM PT RETURN 9FT ADLT (ELECTROSURGICAL) ×3
ELECTRODE BLDE 4.0 EZ CLN MEGD (MISCELLANEOUS) ×1 IMPLANT
ELECTRODE REM PT RTRN 9FT ADLT (ELECTROSURGICAL) ×1 IMPLANT
FACESHIELD WRAPAROUND (MASK) ×6 IMPLANT
GLOVE BIO SURGEON STRL SZ7.5 (GLOVE) ×3 IMPLANT
GLOVE BIO SURGEON STRL SZ8.5 (GLOVE) ×3 IMPLANT
GLOVE BIOGEL PI IND STRL 8 (GLOVE) ×1 IMPLANT
GLOVE BIOGEL PI IND STRL 9 (GLOVE) ×1 IMPLANT
GLOVE BIOGEL PI INDICATOR 8 (GLOVE) ×2
GLOVE BIOGEL PI INDICATOR 9 (GLOVE) ×2
GOWN STRL REUS W/ TWL LRG LVL3 (GOWN DISPOSABLE) ×1 IMPLANT
GOWN STRL REUS W/ TWL XL LVL3 (GOWN DISPOSABLE) ×2 IMPLANT
GOWN STRL REUS W/TWL LRG LVL3 (GOWN DISPOSABLE) ×2
GOWN STRL REUS W/TWL XL LVL3 (GOWN DISPOSABLE) ×4
KIT BASIN OR (CUSTOM PROCEDURE TRAY) ×3 IMPLANT
KIT ROOM TURNOVER OR (KITS) ×3 IMPLANT
MANIFOLD NEPTUNE II (INSTRUMENTS) ×3 IMPLANT
NEEDLE HYPO 22GX1.5 SAFETY (NEEDLE) ×6 IMPLANT
NS IRRIG 1000ML POUR BTL (IV SOLUTION) ×3 IMPLANT
PACK TOTAL JOINT (CUSTOM PROCEDURE TRAY) ×3 IMPLANT
PAD ARMBOARD 7.5X6 YLW CONV (MISCELLANEOUS) ×6 IMPLANT
SUT ETHIBOND NAB CT1 #1 30IN (SUTURE) ×6 IMPLANT
SUT VIC AB 0 CT1 27 (SUTURE)
SUT VIC AB 0 CT1 27XBRD ANBCTR (SUTURE) IMPLANT
SUT VIC AB 1 CTX 36 (SUTURE) ×2
SUT VIC AB 1 CTX36XBRD ANBCTR (SUTURE) ×1 IMPLANT
SUT VIC AB 2-0 CT1 27 (SUTURE) ×2
SUT VIC AB 2-0 CT1 TAPERPNT 27 (SUTURE) ×1 IMPLANT
SUT VIC AB 3-0 CT1 27 (SUTURE) ×2
SUT VIC AB 3-0 CT1 TAPERPNT 27 (SUTURE) ×1 IMPLANT
SYR CONTROL 10ML LL (SYRINGE) ×6 IMPLANT
TOWEL OR 17X26 10 PK STRL BLUE (TOWEL DISPOSABLE) ×3 IMPLANT
TRAY CATH 16FR W/PLASTIC CATH (SET/KITS/TRAYS/PACK) IMPLANT

## 2017-06-25 NOTE — Interval H&P Note (Signed)
History and Physical Interval Note:  06/25/2017 12:03 PM  Kimberly Lester  has presented today for surgery, with the diagnosis of LEFT HIP OSTEOARTHRITIS  The various methods of treatment have been discussed with the patient and family. After consideration of risks, benefits and other options for treatment, the patient has consented to  Procedure(s): TOTAL HIP ARTHROPLASTY ANTERIOR APPROACH (Left) as a surgical intervention .  The patient's history has been reviewed, patient examined, no change in status, stable for surgery.  I have reviewed the patient's chart and labs.  Questions were answered to the patient's satisfaction.     Kerin Salen

## 2017-06-25 NOTE — Discharge Instructions (Signed)

## 2017-06-25 NOTE — Evaluation (Signed)
Physical Therapy Evaluation Patient Details Name: Kimberly Lester MRN: [id number] DOB: 1931/04/19 Today's Date: 06/25/2017   History of Present Illness  Pt is an 84 y/o female s/p L THA, anterior approach. PMH includes CVA, HTN, and R THA.   Clinical Impression  Pt is s/p surgery above with deficits below. Pt tolerated gait training well and required min to min guard A with mobility. HEP initiated and reviewed anterior hip precautions. Will continue to follow acutely to maximize functional mobility independence and safety.     Follow Up Recommendations DC plan and follow up therapy as arranged by surgeon;Supervision for mobility/OOB    Equipment Recommendations  None recommended by PT    Recommendations for Other Services       Precautions / Restrictions Precautions Precautions: Anterior Hip Precaution Booklet Issued: Yes (comment) Precaution Comments: Reviewed anterior hip precautions during session. Educated about supine ther ex.  Restrictions Weight Bearing Restrictions: Yes LLE Weight Bearing: Weight bearing as tolerated      Mobility  Bed Mobility Overal bed mobility: Needs Assistance Bed Mobility: Supine to Sit     Supine to sit: Supervision     General bed mobility comments: Supervision for safety. Increased time required. Educated about coming out to the R to avoid hip ABduction.   Transfers Overall transfer level: Needs assistance Equipment used: Rolling walker (2 wheeled) Transfers: Sit to/from Stand Sit to Stand: Min assist         General transfer comment: Min A for lift assist and steadying. Verbal cues for safe hand placement.   Ambulation/Gait Ambulation/Gait assistance: Min guard Ambulation Distance (Feet): 75 Feet Assistive device: Rolling walker (2 wheeled) Gait Pattern/deviations: Step-through pattern;Step-to pattern;Decreased stance time - right;Decreased stance time - left;Decreased weight shift to left;Antalgic Gait velocity: Decreased  Gait  velocity interpretation: Below normal speed for age/gender General Gait Details: Slow, antalgic gait. Verbal cues for sequencing using RW. Educated to turn towards the L to avoid ER of the hip.   Stairs            Wheelchair Mobility    Modified Rankin (Stroke Patients Only)       Balance Overall balance assessment: Needs assistance Sitting-balance support: No upper extremity supported;Feet supported Sitting balance-Leahy Scale: Good     Standing balance support: Bilateral upper extremity supported;During functional activity Standing balance-Leahy Scale: Poor Standing balance comment: Reliant on BUE support.                              Pertinent Vitals/Pain Pain Assessment: 0-10 Pain Score: 3  Pain Location: L hip  Pain Descriptors / Indicators: Aching;Operative site guarding Pain Intervention(s): Limited activity within patient's tolerance;Monitored during session;Repositioned    Home Living Family/patient expects to be discharged to:: Private residence Living Arrangements: Spouse/significant other Available Help at Discharge: Family;Available 24 hours/day Type of Home: House Home Access: Ramped entrance     Home Layout: One level Home Equipment: Walker - 2 wheels;Bedside commode;Cane - single point;Wheelchair - manual      Prior Function Level of Independence: Independent               Hand Dominance   Dominant Hand: Right    Extremity/Trunk Assessment   Upper Extremity Assessment Upper Extremity Assessment: Overall WFL for tasks assessed    Lower Extremity Assessment Lower Extremity Assessment: LLE deficits/detail LLE Deficits / Details: Sensory in tact. Deficits consistent with post op pain and weakness. Able to perform ther  ex below.     Cervical / Trunk Assessment Cervical / Trunk Assessment: Normal  Communication   Communication: No difficulties  Cognition Arousal/Alertness: Awake/alert Behavior During Therapy: WFL for  tasks assessed/performed Overall Cognitive Status: Within Functional Limits for tasks assessed                                        General Comments General comments (skin integrity, edema, etc.): Pt's husband and grandaughter present during session.     Exercises Total Joint Exercises Ankle Circles/Pumps: AROM;Both;20 reps Quad Sets: AROM;Left;10 reps Heel Slides: AROM;Left;10 reps   Assessment/Plan    PT Assessment Patient needs continued PT services  PT Problem List Decreased strength;Decreased balance;Decreased range of motion;Decreased mobility;Decreased knowledge of use of DME;Pain;Decreased knowledge of precautions       PT Treatment Interventions DME instruction;Gait training;Functional mobility training;Therapeutic activities;Therapeutic exercise;Balance training;Neuromuscular re-education;Patient/family education    PT Goals (Current goals can be found in the Care Plan section)  Acute Rehab PT Goals Patient Stated Goal: to go home  PT Goal Formulation: With patient Time For Goal Achievement: 07/09/17 Potential to Achieve Goals: Good    Frequency 7X/week   Barriers to discharge        Co-evaluation               AM-PAC PT "6 Clicks" Daily Activity  Outcome Measure Difficulty turning over in bed (including adjusting bedclothes, sheets and blankets)?: A Little Difficulty moving from lying on back to sitting on the side of the bed? : A Little Difficulty sitting down on and standing up from a chair with arms (e.g., wheelchair, bedside commode, etc,.)?: Unable Help needed moving to and from a bed to chair (including a wheelchair)?: A Little Help needed walking in hospital room?: A Little Help needed climbing 3-5 steps with a railing? : A Lot 6 Click Score: 15    End of Session Equipment Utilized During Treatment: Gait belt Activity Tolerance: Patient tolerated treatment well Patient left: in chair;with call bell/phone within reach;with  family/visitor present Nurse Communication: Mobility status PT Visit Diagnosis: Other abnormalities of gait and mobility (R26.89);Pain Pain - Right/Left: Left Pain - part of body: Hip    Time: 1610-9604 PT Time Calculation (min) (ACUTE ONLY): 29 min   Charges:   PT Evaluation $PT Eval Low Complexity: 1 Low PT Treatments $Gait Training: 8-22 mins   PT G Codes:        Leighton Ruff, PT, DPT  Acute Rehabilitation Services  Pager: [phone]   Rudean Hitt 06/25/2017, 6:23 PM

## 2017-06-25 NOTE — Anesthesia Postprocedure Evaluation (Signed)
Anesthesia Post Note  Patient: Kimberly Lester  Procedure(s) Performed: TOTAL HIP ARTHROPLASTY ANTERIOR APPROACH (Left )     Patient location during evaluation: PACU Anesthesia Type: Spinal Level of consciousness: oriented and awake and alert Pain management: pain level controlled Vital Signs Assessment: post-procedure vital signs reviewed and stable Respiratory status: spontaneous breathing and respiratory function stable Cardiovascular status: blood pressure returned to baseline and stable Postop Assessment: no headache, no backache and no apparent nausea or vomiting Anesthetic complications: no    Last Vitals:  Vitals:   06/25/17 1445 06/25/17 1511  BP: 121/66 (!) 113/53  Pulse: 61 61  Resp: 15 15  Temp: 36.4 C 36.4 C  SpO2: 96% 94%    Last Pain:  Vitals:   06/25/17 1511  TempSrc: Oral                 Lynda Rainwater

## 2017-06-25 NOTE — Transfer of Care (Signed)
Immediate Anesthesia Transfer of Care Note  Patient: Kimberly Lester  Procedure(s) Performed: TOTAL HIP ARTHROPLASTY ANTERIOR APPROACH (Left )  Patient Location: PACU  Anesthesia Type:MAC and Spinal  Level of Consciousness: awake, alert , oriented and patient cooperative  Airway & Oxygen Therapy: Patient Spontanous Breathing and Patient connected to face mask oxygen  Post-op Assessment: Report given to RN and Post -op Vital signs reviewed and stable  Post vital signs: Reviewed and stable  Last Vitals:  Vitals:   06/25/17 1004  BP: (!) 121/52  Pulse: 69  Resp: 18  Temp: 36.6 C  SpO2: 97%    Last Pain:  Vitals:   06/25/17 1004  TempSrc: Oral      Patients Stated Pain Goal: 3 (47/09/62 8366)  Complications: No apparent anesthesia complications

## 2017-06-25 NOTE — Op Note (Signed)
OPERATIVE REPORT    DATE OF PROCEDURE:  06/25/2017       PREOPERATIVE DIAGNOSIS:  LEFT HIP OSTEOARTHRITIS                                                          POSTOPERATIVE DIAGNOSIS:  LEFT HIP OSTEOARTHRITIS                                                           PROCEDURE: Anterior L total hip arthroplasty using a 50 mm DePuy Pinnacle  Cup, [employer], 0-degree polyethylene liner, a +5 32 mm ceramic head, a 2 hi Depuy Triloc stem   SURGEON: Kerin Salen    ASSISTANT:   Kerry Hough. [employer]  (present throughout entire procedure and necessary for timely completion of the procedure)   ANESTHESIA: Spinal BLOOD LOSS: 300 FLUID REPLACEMENT: 1400 crystalloid Antibiotic: 2gm ancef Tranexamic Acid: 1gm IV, 2gm Topical COMPLICATIONS: none    INDICATIONS FOR PROCEDURE: A 84 y.o. year-old With  LEFT HIP OSTEOARTHRITIS   for 3 years, x-rays show bone-on-bone arthritic changes, and osteophytes. Despite conservative measures with observation, anti-inflammatory medicine, narcotics, use of a cane, has severe unremitting pain and can ambulate only a few blocks before resting. Patient desires elective L total hip arthroplasty to decrease pain and increase function. The risks, benefits, and alternatives were discussed at length including but not limited to the risks of infection, bleeding, nerve injury, stiffness, blood clots, the need for revision surgery, cardiopulmonary complications, among others, and they were willing to proceed. Questions answered     PROCEDURE IN DETAIL: The patient was identified by armband,  received preoperative IV antibiotics in the holding area at [facility], taken to the operating room , appropriate anesthetic monitors  were attached and  anesthesia was induced with the patienton the gurney. The HANA boots were applied to the feet and he was then transferred to the HANA table with a peroneal post and support underneath the non-operative le,  which was locked in 5 lb traction. Theoperative lower extremity was then prepped and draped in the usual sterile fashion from just above the iliac crest to the knee. And a timeout procedure was performed. We then made a 13 cm incision along the interval at the leading edge of the tensor fascia lata of starting at 2 cm lateral to and 2 cm distal to the ASIS. Small bleeders in the skin and subcutaneous tissue identified and cauterized we dissected down to the fascia and made an incision in the fascia allowing [place] to elevate the fascia of the tensor muscle and exploited the interval between the rectus and the tensor fascia lata. A Hohmann retractor was then placed along the superior neck of the femur and a Cobra retractor along the inferior neck of the femur we teed the capsule starting out at the superior anterior aspect of the acetabulum going distally and made the T along the neck both leaflets of the T were tagged with #2 Ethibond suture. Cobra retractors were then placed along the inferior and superior neck allowing [place] to perform a standard neck cut  and removed the femoral head with a power corkscrew. We then placed a right angle Hohmann retractor along the anterior aspect of the acetabulum a spiked Cobra in the cotyloid notch and posteriorly a Muelller retractor. We then sequentially reamed up to a 49 mm basket reamer obtaining good coverage in all quadrants, verified by C-arm imaging. Under C-arm control with and hammered into place a 50 mm Pinnacle cup in 45 of abduction and 15 of anteversion. The cup seated nicely and required no supplemental screws. We then placed a central hole Eliminator and a 0 polyethylene liner. The foot was then externally rotated to 110, the HANA elevator was placed around the flare of the greater trochanter and the limb was extended and abducted delivering the proximal femur up into the wound. A medium Hohmann retractor was placed over the greater trochanter and a Mueller retractor  along the posterior femoral neck completing the exposure. We then performed releases superiorly and and inferiorly of the capsule going back to the pirformis fossa superiorly and to the lesser trochanter inferiorly. We then entered the proximal femur with the box cutting offset chisel followed by, a canal sounder, the chili pepper and broaching up to a 2 broach. This seated nicely and we reamed the calcar. A trial reduction was performed with a 5 mm 32 mm head.The limb lengths were excellent the hip was stable in 90 of external rotation. At this point the trial components removed and we hammered into place a # 2 hi  Offset Tri-Lock stem with Gryption coating. A + 5 32 mm ceramic ball was then hammered into place the hip was reduced and final C-arm images obtained. The wound was thoroughly irrigated with normal saline solution. We repaired the ant capsule and the tensor fascia lot a with running 0 vicryl suture. the subcutaneous tissue was closed with 2-0 and 3-0 Vicryl suture followed by an Aquacil dressing. At this point the patient was awaken and transferred to hospital gurney without difficulty. The subcutaneous tissue with 0 and 2-0 undyed Vicryl suture and the skin with running  3-0 vicryl subcuticular suture. Aquacil dressing was applied. The patient was then unclamped, rolled supine, awaken extubated and taken to recovery room without difficulty in stable condition.   Kerin Salen 06/25/2017, 1:21 PM

## 2017-06-25 NOTE — Anesthesia Procedure Notes (Signed)
Spinal  Patient location during procedure: OR Start time: 06/25/2017 12:14 PM End time: 06/25/2017 12:19 PM Staffing Anesthesiologist: Lynda Rainwater, MD Performed: anesthesiologist  Preanesthetic Checklist Completed: patient identified, site marked, surgical consent, pre-op evaluation, timeout performed, IV checked, risks and benefits discussed and monitors and equipment checked Spinal Block Patient position: sitting Prep: Betadine Patient monitoring: heart rate, cardiac monitor, continuous pulse ox and blood pressure Approach: right paramedian Location: L3-4 Injection technique: single-shot Needle Needle type: Quincke  Needle gauge: 22 G Needle length: 9 cm

## 2017-06-25 NOTE — Plan of Care (Signed)
  Progressing Education: Knowledge of General Education information will improve 06/25/2017 1554 - Progressing by Rance Muir, RN Health Behavior/Discharge Planning: Ability to manage health-related needs will improve 06/25/2017 1554 - Progressing by Rance Muir, RN Clinical Measurements: Ability to maintain clinical measurements within normal limits will improve 06/25/2017 1554 - Progressing by Rance Muir, RN Will remain free from infection 06/25/2017 1554 - Progressing by Rance Muir, RN Diagnostic test results will improve 06/25/2017 1554 - Progressing by Rance Muir, RN Respiratory complications will improve 06/25/2017 1554 - Progressing by Rance Muir, RN Cardiovascular complication will be avoided 06/25/2017 1554 - Progressing by Rance Muir, RN Activity: Risk for activity intolerance will decrease 06/25/2017 1554 - Progressing by Rance Muir, RN Nutrition: Adequate nutrition will be maintained 06/25/2017 1554 - Progressing by Rance Muir, RN Coping: Level of anxiety will decrease 06/25/2017 1554 - Progressing by Rance Muir, RN Elimination: Will not experience complications related to bowel motility 06/25/2017 1554 - Progressing by Rance Muir, RN Will not experience complications related to urinary retention 06/25/2017 1554 - Progressing by Rance Muir, RN Pain Managment: General experience of comfort will improve 06/25/2017 1554 - Progressing by Rance Muir, RN Safety: Ability to remain free from injury will improve 06/25/2017 1554 - Progressing by Rance Muir, RN Skin Integrity: Risk for impaired skin integrity will decrease 06/25/2017 1554 - Progressing by Rance Muir, RN

## 2017-06-25 NOTE — Anesthesia Preprocedure Evaluation (Addendum)
Anesthesia Evaluation  Patient identified by MRN, date of birth, ID band Patient awake    Reviewed: Allergy & Precautions, NPO status , Patient's Chart, lab work & pertinent test results, reviewed documented beta blocker date and time   Airway Mallampati: II  TM Distance: >3 FB Neck ROM: Full    Dental no notable dental hx. (+) Edentulous Upper, Missing, Partial Lower   Pulmonary neg pulmonary ROS,    Pulmonary exam normal breath sounds clear to auscultation       Cardiovascular hypertension, Pt. on medications and Pt. on home beta blockers negative cardio ROS Normal cardiovascular exam Rhythm:Regular Rate:Normal     Neuro/Psych CVA negative neurological ROS  negative psych ROS   GI/Hepatic negative GI ROS, Neg liver ROS, GERD  ,  Endo/Other  negative endocrine ROS  Renal/GU negative Renal ROS  negative genitourinary   Musculoskeletal negative musculoskeletal ROS (+) Arthritis , Osteoarthritis,    Abdominal   Peds negative pediatric ROS (+)  Hematology negative hematology ROS (+)   Anesthesia Other Findings   Reproductive/Obstetrics negative OB ROS                            Anesthesia Physical Anesthesia Plan  ASA: III  Anesthesia Plan: Spinal   Post-op Pain Management:    Induction: Intravenous  PONV Risk Score and Plan: 2 and Ondansetron and Midazolam  Airway Management Planned: Simple Face Mask  Additional Equipment:   Intra-op Plan:   Post-operative Plan:   Informed Consent: I have reviewed the patients History and Physical, chart, labs and discussed the procedure including the risks, benefits and alternatives for the proposed anesthesia with the patient or authorized representative who has indicated his/her understanding and acceptance.   Dental advisory given  Plan Discussed with: CRNA  Anesthesia Plan Comments:         Anesthesia Quick Evaluation

## 2017-06-25 NOTE — Progress Notes (Signed)
Pt takes 100mg  Toprol XL HS and 50MG  Toprol-XL QAM. Pt states she accidentally took her AM and PM medicine last night and took 150MG  Toprol-XL at 23:30 last night.   BP 1221/52 HR in 60s today. Notified Dr. Sabra Heck. No need to give beta blocker today.

## 2017-06-26 ENCOUNTER — Encounter (HOSPITAL_COMMUNITY): Payer: Self-pay | Admitting: Orthopedic Surgery

## 2017-06-26 LAB — BASIC METABOLIC PANEL
Anion gap: 14 (ref 5–15)
BUN: 18 mg/dL (ref 6–20)
CHLORIDE: 102 mmol/L (ref 101–111)
CO2: 22 mmol/L (ref 22–32)
Calcium: 8.8 mg/dL — ABNORMAL LOW (ref 8.9–10.3)
Creatinine, Ser: 0.92 mg/dL (ref 0.44–1.00)
GFR calc Af Amer: >60 mL/min (ref >60)
GFR calc non Af Amer: 56 mL/min — ABNORMAL LOW (ref >60)
GLUCOSE: 130 mg/dL — AB (ref 65–99)
POTASSIUM: 3.8 mmol/L (ref 3.5–5.1)
Sodium: 138 mmol/L (ref 135–145)

## 2017-06-26 LAB — CBC
HCT: 32.7 % — ABNORMAL LOW (ref 36.0–46.0)
HEMOGLOBIN: 10.3 g/dL — AB (ref 12.0–15.0)
MCH: 28.4 pg (ref 26.0–34.0)
MCHC: 31.5 g/dL (ref 30.0–36.0)
MCV: 90.1 fL (ref 78.0–100.0)
Platelets: 175 10*3/uL (ref 150–400)
RBC: 3.63 MIL/uL — AB (ref 3.87–5.11)
RDW: 14 % (ref 11.5–15.5)
WBC: 7.6 10*3/uL (ref 4.0–10.5)

## 2017-06-26 NOTE — Progress Notes (Signed)
Physical Therapy Treatment Patient Details Name: Kimberly Lester MRN: [id number] DOB: 1930/09/13 Today's Date: 06/26/2017    History of Present Illness Pt is an 84 y/o female s/p L THA, anterior approach. PMH includes CVA, HTN, and R THA.     PT Comments    Patient doing well with therapy this morning. Min A to help with transfers, however able to ambulate with good stability with only supervision. Ambulated half the unit with no LOB or concerns. Patients family not present, would like to visit with them to discuss hip precautions and guarding for safety at home as patient cannot recall 2/3 hip precautions after several PT sessions. From mobility standpoint, patient safe to return home once medically cleared for d/c, with supervision and family support and will do well with HHPT.     Follow Up Recommendations  DC plan and follow up therapy as arranged by surgeon;Supervision for mobility/OOB     Equipment Recommendations  None recommended by PT    Recommendations for Other Services       Precautions / Restrictions Precautions Precautions: Anterior Hip Precaution Booklet Issued: Yes (comment) Precaution Comments: Reviewed anterior hip precautions during session. Educated about supine ther ex.  Restrictions Weight Bearing Restrictions: Yes LLE Weight Bearing: Weight bearing as tolerated    Mobility  Bed Mobility               General bed mobility comments: OOB at entry  Transfers Overall transfer level: Needs assistance Equipment used: Rolling walker (2 wheeled) Transfers: Sit to/from Stand Sit to Stand: Min assist         General transfer comment: Min A for lift assist and steadying. Verbal cues for safe hand placement.   Ambulation/Gait Ambulation/Gait assistance: Supervision Ambulation Distance (Feet): 200 Feet Assistive device: Rolling walker (2 wheeled) Gait Pattern/deviations: Step-through pattern;Step-to pattern;Decreased stance time - right;Decreased stance  time - left;Decreased weight shift to left;Antalgic Gait velocity: Decreased    General Gait Details: patient ambulating well with increased velocity and less pain. step through gait. discussed adherence to Ant Hip precautions during turns.    Stairs            Wheelchair Mobility    Modified Rankin (Stroke Patients Only)       Balance Overall balance assessment: Needs assistance Sitting-balance support: No upper extremity supported;Feet supported Sitting balance-Leahy Scale: Good     Standing balance support: Bilateral upper extremity supported;During functional activity Standing balance-Leahy Scale: Poor Standing balance comment: Reliant on BUE support.                             Cognition Arousal/Alertness: Awake/alert Behavior During Therapy: WFL for tasks assessed/performed Overall Cognitive Status: Within Functional Limits for tasks assessed                                        Exercises      General Comments        Pertinent Vitals/Pain Pain Assessment: Faces Faces Pain Scale: Hurts a little bit Pain Location: L hip  Pain Descriptors / Indicators: Aching;Operative site guarding Pain Intervention(s): Limited activity within patient's tolerance;Monitored during session;Premedicated before session;Repositioned    Home Living                      Prior Function  PT Goals (current goals can now be found in the care plan section) Acute Rehab PT Goals Patient Stated Goal: to go home  PT Goal Formulation: With patient Time For Goal Achievement: 07/09/17 Potential to Achieve Goals: Good Progress towards PT goals: Progressing toward goals    Frequency    7X/week      PT Plan Current plan remains appropriate    Co-evaluation              AM-PAC PT "6 Clicks" Daily Activity  Outcome Measure  Difficulty turning over in bed (including adjusting bedclothes, sheets and blankets)?: A  Little Difficulty moving from lying on back to sitting on the side of the bed? : A Little Difficulty sitting down on and standing up from a chair with arms (e.g., wheelchair, bedside commode, etc,.)?: Unable Help needed moving to and from a bed to chair (including a wheelchair)?: A Little Help needed walking in hospital room?: A Little Help needed climbing 3-5 steps with a railing? : A Lot 6 Click Score: 15    End of Session Equipment Utilized During Treatment: Gait belt Activity Tolerance: Patient tolerated treatment well Patient left: in chair;with call bell/phone within reach;with family/visitor present Nurse Communication: Mobility status PT Visit Diagnosis: Other abnormalities of gait and mobility (R26.89);Pain Pain - Right/Left: Left Pain - part of body: Hip     Time: 1448-1856 PT Time Calculation (min) (ACUTE ONLY): 20 min  Charges:  $Gait Training: 8-22 mins                    G Codes:       Reinaldo Berber, PT, DPT Acute Rehab Services Pager: [phone]     Reinaldo Berber 06/26/2017, 9:56 AM

## 2017-06-26 NOTE — Progress Notes (Signed)
Physical Therapy Treatment and Discharge Patient Details Name: Kimberly Lester MRN: [id number] DOB: 1930-06-03 Today's Date: 06/26/2017    History of Present Illness Pt is an 84 y/o female s/p L THA, anterior approach. PMH includes CVA, HTN, and R THA.     PT Comments    Session focused on family education and gait training. Patient now supervision for all mobility. Pt and family educated on safety considerations for home and have no further questions or concerns at this time. Pt has met all functional goals and will benefit from skilled home health PT when medically cleared for d/c.     Follow Up Recommendations  DC plan and follow up therapy as arranged by surgeon;Supervision for mobility/OOB     Equipment Recommendations  None recommended by PT    Recommendations for Other Services       Precautions / Restrictions Precautions Precautions: Anterior Hip Precaution Booklet Issued: Yes (comment) Precaution Comments: Reviewed anterior hip precautions during session. Educated about supine ther ex.  Restrictions Weight Bearing Restrictions: Yes LLE Weight Bearing: Weight bearing as tolerated    Mobility  Bed Mobility               General bed mobility comments: OOB at entry  Transfers Overall transfer level: Needs assistance Equipment used: Rolling walker (2 wheeled) Transfers: Sit to/from Stand Sit to Stand: Supervision         General transfer comment: Supervision for safety  Ambulation/Gait Ambulation/Gait assistance: Supervision Ambulation Distance (Feet): 200 Feet Assistive device: Rolling walker (2 wheeled) Gait Pattern/deviations: Step-through pattern;Step-to pattern;Decreased stance time - right;Decreased stance time - left;Decreased weight shift to left;Antalgic Gait velocity: decreased   General Gait Details: Patient ambulating well, discussed with family proper guarding position and hip precautions during mobility.    Stairs             Wheelchair Mobility    Modified Rankin (Stroke Patients Only)       Balance Overall balance assessment: Needs assistance Sitting-balance support: No upper extremity supported;Feet supported Sitting balance-Leahy Scale: Good     Standing balance support: Bilateral upper extremity supported;During functional activity Standing balance-Leahy Scale: Poor Standing balance comment: Reliant on BUE support.                             Cognition Arousal/Alertness: Awake/alert Behavior During Therapy: WFL for tasks assessed/performed Overall Cognitive Status: Within Functional Limits for tasks assessed                                        Exercises      General Comments        Pertinent Vitals/Pain Pain Assessment: Faces Faces Pain Scale: Hurts a little bit Pain Location: L hip  Pain Descriptors / Indicators: Aching;Operative site guarding Pain Intervention(s): Limited activity within patient's tolerance;Monitored during session;Premedicated before session;Repositioned    Home Living                      Prior Function            PT Goals (current goals can now be found in the care plan section) Acute Rehab PT Goals Patient Stated Goal: to go home  PT Goal Formulation: With patient Time For Goal Achievement: 07/09/17 Potential to Achieve Goals: Good Progress towards PT goals: Goals met/education completed, patient discharged  from PT    Frequency           PT Plan Current plan remains appropriate    Co-evaluation              AM-PAC PT "6 Clicks" Daily Activity  Outcome Measure  Difficulty turning over in bed (including adjusting bedclothes, sheets and blankets)?: A Little Difficulty moving from lying on back to sitting on the side of the bed? : A Little Difficulty sitting down on and standing up from a chair with arms (e.g., wheelchair, bedside commode, etc,.)?: A Little Help needed moving to and from a bed to  chair (including a wheelchair)?: A Little Help needed walking in hospital room?: A Little Help needed climbing 3-5 steps with a railing? : A Little 6 Click Score: 18    End of Session Equipment Utilized During Treatment: Gait belt Activity Tolerance: Patient tolerated treatment well Patient left: in chair;with call bell/phone within reach;with family/visitor present Nurse Communication: Mobility status PT Visit Diagnosis: Other abnormalities of gait and mobility (R26.89);Pain Pain - Right/Left: Left Pain - part of body: Hip     Time: 9967-2277 PT Time Calculation (min) (ACUTE ONLY): 18 min  Charges:  $Gait Training: 8-22 mins                    G Codes:      Reinaldo Berber, PT, DPT Acute Rehab Services Pager: [phone]     Reinaldo Berber 06/26/2017, 3:09 PM

## 2017-06-26 NOTE — Progress Notes (Signed)
PATIENT ID: Kimberly Lester  MRN: [id number]  DOB/AGE:  10/16/1930 / 84 y.o.  1 Day Post-Op Procedure(s) (LRB): TOTAL HIP ARTHROPLASTY ANTERIOR APPROACH (Left)    PROGRESS NOTE Subjective: Patient is alert, oriented, no Nausea, no Vomiting, yes passing gas, . Taking PO well. Denies SOB, Chest or Calf Pain. Using Incentive Spirometer, PAS in place. Ambulate WBAT Patient reports pain as  2/10  .    Objective: Vital signs in last 24 hours: Vitals:   06/25/17 1511 06/25/17 2002 06/26/17 0012 06/26/17 0606  BP: (!) 113/53 (!) 99/50 (!) 111/58 (!) 110/58  Pulse: 61 73 68 71  Resp: 15 15 15 16   Temp: 97.6 F (36.4 C) (!) 97.5 F (36.4 C) 98.2 F (36.8 C) 97.6 F (36.4 C)  TempSrc: Oral Axillary Oral Oral  SpO2: 94% 92% 92% 93%  Weight:      Height:          Intake/Output from previous day: I/O last 3 completed shifts: In: 2010 [P.O.:960; I.V.:800; IV Piggyback:250] Out: 2400 [Urine:1900; Blood:500]   Intake/Output this shift: No intake/output data recorded.   LABORATORY DATA: Recent Labs    06/23/17 1355 06/26/17 0637  WBC 6.0 7.6  HGB 12.5 10.3*  HCT 38.6 32.7*  PLT 211 175  NA 138 138  K 3.9 3.8  CL 104 102  CO2 23 22  BUN 28* 18  CREATININE 1.27* 0.92  GLUCOSE 91 130*  INR 1.01  --   CALCIUM 9.3 8.8*    Examination: Neurologically intact ABD soft Neurovascular intact Sensation intact distally Intact pulses distally Dorsiflexion/Plantar flexion intact Incision: dressing C/D/I No cellulitis present Compartment soft} XR AP&Lat of hip shows well placed\fixed THA  Assessment:   1 Day Post-Op Procedure(s) (LRB): TOTAL HIP ARTHROPLASTY ANTERIOR APPROACH (Left) ADDITIONAL DIAGNOSIS:  Expected Acute Blood Loss Anemia, Hypertension, hx CVA  Plan: PT/OT WBAT, THA  DVT Prophylaxis: SCDx72 hrs, ASA 325 mg BID x 2 weeks  DISCHARGE PLAN: Home, today if passes PT  DISCHARGE NEEDS: HHPT, Walker and 3-in-1 comode seat

## 2017-06-26 NOTE — Discharge Summary (Signed)
Patient ID: Kimberly Lester MRN: [id number] DOB/AGE: Oct 16, 1930 84 y.o.  Admit date: 06/25/2017 Discharge date: 06/26/2017  Admission Diagnoses:  Active Problems:   Primary osteoarthritis of left hip   Discharge Diagnoses:  Same  Past Medical History:  Diagnosis Date  . GERD (gastroesophageal reflux disease)   . Hypercholesterolemia   . Hypertension   . Osteoarthritis   . Restless leg syndrome   . Stroke ([place]) 06/16/2011   some balance issues, finger numbness  . TIA (transient ischemic attack) 07/20/2011    Surgeries: Procedure(s): TOTAL HIP ARTHROPLASTY ANTERIOR APPROACH on 06/25/2017   Consultants:   Discharged Condition: Improved  Hospital Course: Kimberly Lester is an 84 y.o. female who was admitted 06/25/2017 for operative treatment of<principal problem not specified>. Patient has severe unremitting pain that affects sleep, daily activities, and work/hobbies. After pre-op clearance the patient was taken to the operating room on 06/25/2017 and underwent  Procedure(s): TOTAL HIP ARTHROPLASTY ANTERIOR APPROACH.    Patient was given perioperative antibiotics:  Anti-infectives (From admission, onward)   Start     Dose/Rate Route Frequency Ordered Stop   06/25/17 1100  ceFAZolin (ANCEF) IVPB 2g/100 mL premix     2 g 200 mL/hr over 30 Minutes Intravenous On call to O.R. 06/25/17 7124 06/25/17 1229       Patient was given sequential compression devices, early ambulation, and chemoprophylaxis to prevent DVT.  Patient benefited maximally from hospital stay and there were no complications.    Recent vital signs:  Patient Vitals for the past 24 hrs:  BP Temp Temp src Pulse Resp SpO2  06/26/17 1042 104/67 - - - - -  06/26/17 0606 (!) 110/58 97.6 F (36.4 C) Oral 71 16 93 %  06/26/17 0012 (!) 111/58 98.2 F (36.8 C) Oral 68 15 92 %  06/25/17 2002 (!) 99/50 (!) 97.5 F (36.4 C) Axillary 73 15 92 %     Recent laboratory studies:  Recent Labs    06/26/17 0637  WBC 7.6  HGB  10.3*  HCT 32.7*  PLT 175  NA 138  K 3.8  CL 102  CO2 22  BUN 18  CREATININE 0.92  GLUCOSE 130*  CALCIUM 8.8*     Discharge Medications:   Allergies as of 06/26/2017      Reactions   Lorazepam Other (See Comments)   "makes her crazy"   Percocet [oxycodone-acetaminophen] Nausea And Vomiting      Medication List    TAKE these medications   acetaminophen 500 MG tablet Commonly known as:  TYLENOL Take 500 mg by mouth every 6 (six) hours as needed for mild pain.   amLODipine 5 MG tablet Commonly known as:  NORVASC Take 5 mg by mouth daily.   aspirin EC 325 MG tablet Take 1 tablet (325 mg total) by mouth 2 (two) times daily. What changed:    medication strength  how much to take  when to take this   clopidogrel 75 MG tablet Commonly known as:  PLAVIX Take 1 tablet (75 mg total) by mouth daily.   DULoxetine 30 MG capsule Commonly known as:  CYMBALTA Take 30 mg by mouth 2 (two) times daily.   gabapentin 100 MG capsule Commonly known as:  NEURONTIN Take 200 mg by mouth at bedtime.   hydrochlorothiazide 25 MG tablet Commonly known as:  HYDRODIURIL Take 25 mg by mouth daily.   HYDROcodone-acetaminophen 5-325 MG tablet Commonly known as:  NORCO Take 1 tablet by mouth every 6 (six) hours as  needed.   lansoprazole 30 MG capsule Commonly known as:  PREVACID Take 30 mg by mouth daily at 12 noon.   lisinopril 20 MG tablet Commonly known as:  PRINIVIL,ZESTRIL Take a whole pill in am and half a pill in pm. What changed:    how much to take  how to take this  when to take this  additional instructions   meclizine 25 MG tablet Commonly known as:  ANTIVERT Take 25 mg by mouth 3 (three) times daily as needed for dizziness.   methyl salicylate-menthol ointment Apply 1 application topically as needed for pain. For muscle/joint pain   metoprolol succinate 50 MG 24 hr tablet Commonly known as:  TOPROL-XL Take 1 tablet (50 mg total) by mouth 2 (two) times  daily. Take with or immediately following a meal. What changed:    how much to take  additional instructions   psyllium 28 % packet Commonly known as:  METAMUCIL SMOOTH TEXTURE Take 1 packet by mouth daily.   rOPINIRole 1 MG tablet Commonly known as:  REQUIP Take 2 mg by mouth at bedtime.   rosuvastatin 40 MG tablet Commonly known as:  CRESTOR Take 40 mg by mouth every evening.   tiZANidine 2 MG tablet Commonly known as:  ZANAFLEX Take 1 tablet (2 mg total) by mouth every 6 (six) hours as needed for muscle spasms.   traMADol 50 MG tablet Commonly known as:  ULTRAM Take 50 mg by mouth every 6 (six) hours as needed for moderate pain.            Durable Medical Equipment  (From admission, onward)        Start     Ordered   06/25/17 1501  DME Walker rolling  Once    Question:  Patient needs a walker to treat with the following condition  Answer:  Status post total hip replacement, left   06/25/17 1500   06/25/17 1501  DME 3 n 1  Once     06/25/17 1500   06/25/17 1501  DME Bedside commode  Once    Question:  Patient needs a bedside commode to treat with the following condition  Answer:  Status post total hip replacement, left   06/25/17 1500      Diagnostic Studies: Dg Chest 2 View  Result Date: 06/04/2017 CLINICAL DATA:  Preoperative for hip surgery EXAM: CHEST  2 VIEW COMPARISON:  07/17/2012 chest radiograph. FINDINGS: Stable cardiomediastinal silhouette with heart size and moderately tortuous atherosclerotic thoracic aorta. No pneumothorax. No pleural effusion. Mild left basilar scarring versus atelectasis. No pulmonary edema. No acute consolidative airspace disease. IMPRESSION: Mild left basilar scarring versus atelectasis. Otherwise no active disease in the chest. Electronically Signed   By: Ilona Sorrel M.D.   On: 06/04/2017 02:57   Dg C-arm 1-60 Min  Result Date: 06/25/2017 CLINICAL DATA:  Left hip replacement EXAM: OPERATIVE LEFT HIP WITH PELVIS; DG C-ARM  61-120 MIN COMPARISON:  None. FLUOROSCOPY TIME:  Radiation Exposure Index (as provided by the fluoroscopic device): Not available If the device does not provide the exposure index: Fluoroscopy Time:  Not available Number of Acquired Images:  2 FINDINGS: Left hip replacement is noted in satisfactory position. No acute bony abnormality is noted. IMPRESSION: Status post left hip replacement Electronically Signed   By: Inez Catalina M.D.   On: 06/25/2017 16:29   Dg Hip Operative Unilat W Or W/o Pelvis Left  Result Date: 06/25/2017 CLINICAL DATA:  Left hip replacement EXAM: OPERATIVE LEFT  HIP WITH PELVIS; DG C-ARM 61-120 MIN COMPARISON:  None. FLUOROSCOPY TIME:  Radiation Exposure Index (as provided by the fluoroscopic device): Not available If the device does not provide the exposure index: Fluoroscopy Time:  Not available Number of Acquired Images:  2 FINDINGS: Left hip replacement is noted in satisfactory position. No acute bony abnormality is noted. IMPRESSION: Status post left hip replacement Electronically Signed   By: Inez Catalina M.D.   On: 06/25/2017 16:29    Disposition: 06-Home-Health Care Svc  Discharge Instructions    Call MD / Call 911   Complete by:  As directed    If you experience chest pain or shortness of breath, CALL 911 and be transported to the hospital emergency room.  If you develope a fever above 101 F, pus (white drainage) or increased drainage or redness at the wound, or calf pain, call your surgeon's office.   Constipation Prevention   Complete by:  As directed    Drink plenty of fluids.  Prune juice may be helpful.  You may use a stool softener, such as Colace (over the counter) 100 mg twice a day.  Use MiraLax (over the counter) for constipation as needed.   Diet - low sodium heart healthy   Complete by:  As directed    Driving restrictions   Complete by:  As directed    No driving for 2 weeks   Increase activity slowly as tolerated   Complete by:  As directed    Patient  may shower   Complete by:  As directed    You may shower without a dressing once there is no drainage.  Do not wash over the wound.  If drainage remains, cover wound with plastic wrap and then shower.      Follow-up Information    Frederik Pear, MD Follow up in 2 week(s).   Specialty:  Orthopedic Surgery Contact information: [place] [id number] [phone]            Signed: Joanell Rising 06/26/2017, 5:32 PM

## 2017-07-03 DIAGNOSIS — E119 Type 2 diabetes mellitus without complications: Secondary | ICD-10-CM | POA: Diagnosis not present

## 2017-07-03 DIAGNOSIS — E538 Deficiency of other specified B group vitamins: Secondary | ICD-10-CM | POA: Diagnosis not present

## 2017-07-03 DIAGNOSIS — Z79899 Other long term (current) drug therapy: Secondary | ICD-10-CM | POA: Diagnosis not present

## 2017-07-03 DIAGNOSIS — I10 Essential (primary) hypertension: Secondary | ICD-10-CM | POA: Diagnosis not present

## 2017-07-03 DIAGNOSIS — E7800 Pure hypercholesterolemia, unspecified: Secondary | ICD-10-CM | POA: Diagnosis not present

## 2017-07-03 DIAGNOSIS — E559 Vitamin D deficiency, unspecified: Secondary | ICD-10-CM | POA: Diagnosis not present

## 2017-07-08 DIAGNOSIS — M1612 Unilateral primary osteoarthritis, left hip: Secondary | ICD-10-CM | POA: Diagnosis not present

## 2017-07-23 DIAGNOSIS — C4442 Squamous cell carcinoma of skin of scalp and neck: Secondary | ICD-10-CM | POA: Diagnosis not present

## 2017-07-23 DIAGNOSIS — L570 Actinic keratosis: Secondary | ICD-10-CM | POA: Diagnosis not present

## 2017-07-23 DIAGNOSIS — L728 Other follicular cysts of the skin and subcutaneous tissue: Secondary | ICD-10-CM | POA: Diagnosis not present

## 2017-07-23 DIAGNOSIS — L578 Other skin changes due to chronic exposure to nonionizing radiation: Secondary | ICD-10-CM | POA: Diagnosis not present

## 2017-07-23 DIAGNOSIS — L820 Inflamed seborrheic keratosis: Secondary | ICD-10-CM | POA: Diagnosis not present

## 2017-08-05 DIAGNOSIS — Z96642 Presence of left artificial hip joint: Secondary | ICD-10-CM | POA: Diagnosis not present

## 2017-09-11 DIAGNOSIS — I10 Essential (primary) hypertension: Secondary | ICD-10-CM | POA: Diagnosis not present

## 2017-09-11 DIAGNOSIS — Z9181 History of falling: Secondary | ICD-10-CM | POA: Diagnosis not present

## 2017-09-11 DIAGNOSIS — M1712 Unilateral primary osteoarthritis, left knee: Secondary | ICD-10-CM | POA: Diagnosis not present

## 2017-09-11 DIAGNOSIS — G8929 Other chronic pain: Secondary | ICD-10-CM | POA: Diagnosis not present

## 2017-09-11 DIAGNOSIS — Z8673 Personal history of transient ischemic attack (TIA), and cerebral infarction without residual deficits: Secondary | ICD-10-CM | POA: Diagnosis not present

## 2017-09-11 DIAGNOSIS — Z6828 Body mass index (BMI) 28.0-28.9, adult: Secondary | ICD-10-CM | POA: Diagnosis not present

## 2017-09-11 DIAGNOSIS — G2581 Restless legs syndrome: Secondary | ICD-10-CM | POA: Diagnosis not present

## 2017-09-11 DIAGNOSIS — E7800 Pure hypercholesterolemia, unspecified: Secondary | ICD-10-CM | POA: Diagnosis not present

## 2017-09-11 DIAGNOSIS — Z79899 Other long term (current) drug therapy: Secondary | ICD-10-CM | POA: Diagnosis not present

## 2017-09-11 DIAGNOSIS — E119 Type 2 diabetes mellitus without complications: Secondary | ICD-10-CM | POA: Diagnosis not present

## 2017-09-11 DIAGNOSIS — F419 Anxiety disorder, unspecified: Secondary | ICD-10-CM | POA: Diagnosis not present

## 2017-09-11 DIAGNOSIS — Z0000 Encounter for general adult medical examination without abnormal findings: Secondary | ICD-10-CM | POA: Diagnosis not present

## 2017-09-11 DIAGNOSIS — M150 Primary generalized (osteo)arthritis: Secondary | ICD-10-CM | POA: Diagnosis not present

## 2017-09-11 DIAGNOSIS — K219 Gastro-esophageal reflux disease without esophagitis: Secondary | ICD-10-CM | POA: Diagnosis not present

## 2017-10-29 DIAGNOSIS — L821 Other seborrheic keratosis: Secondary | ICD-10-CM | POA: Diagnosis not present

## 2017-10-29 DIAGNOSIS — L578 Other skin changes due to chronic exposure to nonionizing radiation: Secondary | ICD-10-CM | POA: Diagnosis not present

## 2017-10-29 DIAGNOSIS — L820 Inflamed seborrheic keratosis: Secondary | ICD-10-CM | POA: Diagnosis not present

## 2017-10-29 DIAGNOSIS — L570 Actinic keratosis: Secondary | ICD-10-CM | POA: Diagnosis not present

## 2017-10-30 DIAGNOSIS — M25552 Pain in left hip: Secondary | ICD-10-CM | POA: Diagnosis not present

## 2017-12-22 DIAGNOSIS — Z6828 Body mass index (BMI) 28.0-28.9, adult: Secondary | ICD-10-CM | POA: Diagnosis not present

## 2017-12-22 DIAGNOSIS — G2581 Restless legs syndrome: Secondary | ICD-10-CM | POA: Diagnosis not present

## 2017-12-22 DIAGNOSIS — E7800 Pure hypercholesterolemia, unspecified: Secondary | ICD-10-CM | POA: Diagnosis not present

## 2017-12-22 DIAGNOSIS — K219 Gastro-esophageal reflux disease without esophagitis: Secondary | ICD-10-CM | POA: Diagnosis not present

## 2017-12-22 DIAGNOSIS — I10 Essential (primary) hypertension: Secondary | ICD-10-CM | POA: Diagnosis not present

## 2017-12-22 DIAGNOSIS — F419 Anxiety disorder, unspecified: Secondary | ICD-10-CM | POA: Diagnosis not present

## 2017-12-22 DIAGNOSIS — E663 Overweight: Secondary | ICD-10-CM | POA: Diagnosis not present

## 2018-04-03 DIAGNOSIS — E663 Overweight: Secondary | ICD-10-CM | POA: Diagnosis not present

## 2018-04-03 DIAGNOSIS — E7800 Pure hypercholesterolemia, unspecified: Secondary | ICD-10-CM | POA: Diagnosis not present

## 2018-04-03 DIAGNOSIS — Z79899 Other long term (current) drug therapy: Secondary | ICD-10-CM | POA: Diagnosis not present

## 2018-04-03 DIAGNOSIS — E119 Type 2 diabetes mellitus without complications: Secondary | ICD-10-CM | POA: Diagnosis not present

## 2018-04-03 DIAGNOSIS — E538 Deficiency of other specified B group vitamins: Secondary | ICD-10-CM | POA: Diagnosis not present

## 2018-04-03 DIAGNOSIS — I10 Essential (primary) hypertension: Secondary | ICD-10-CM | POA: Diagnosis not present

## 2018-04-03 DIAGNOSIS — Z6828 Body mass index (BMI) 28.0-28.9, adult: Secondary | ICD-10-CM | POA: Diagnosis not present

## 2018-04-10 ENCOUNTER — Other Ambulatory Visit: Payer: Self-pay | Admitting: Pharmacist

## 2018-04-10 NOTE — Patient Outreach (Signed)
[facility]) Care Management  04/10/2018  Kimberly Lester Oct 03, 1930 [id number]   Incoming call from [place] in response to the [facility] Medication Adherence Campaign. Speak with patient. HIPAA identifiers verified and verbal consent received.  [name] reports that she takes her lisinopril 20 mg once daily as directed. Patient reports that she rarely misses a dose. Reports that she uses a weekly pillbox to organize her medications. Patient verbalizes understanding of the importance of adherence to her blood pressure medication and blood pressure control. Reports that she saw her PCP this week and was told that her blood pressure at this visit was "perfect". Reports that she also checks her own blood pressure at home, keeps a log and brings this log with her to her appointments. Reports that she last had the lisinopril refilled on 03/13/18 for a 90 day supply.  Denies any medication questions/concerns at this time.  Will close pharmacy episode.  Harlow Asa, PharmD, [place] [facility] [phone]

## 2018-04-29 DIAGNOSIS — L578 Other skin changes due to chronic exposure to nonionizing radiation: Secondary | ICD-10-CM | POA: Diagnosis not present

## 2018-04-29 DIAGNOSIS — L821 Other seborrheic keratosis: Secondary | ICD-10-CM | POA: Diagnosis not present

## 2018-04-29 DIAGNOSIS — L219 Seborrheic dermatitis, unspecified: Secondary | ICD-10-CM | POA: Diagnosis not present

## 2018-04-29 DIAGNOSIS — L570 Actinic keratosis: Secondary | ICD-10-CM | POA: Diagnosis not present

## 2018-05-27 IMAGING — CR DG CHEST 2V
2 series · 2 of 2 positions shown · non-contrast
Comparison: 07/17/2012 chest radiograph.

CLINICAL DATA: Preoperative for hip surgery

EXAM:
CHEST  2 VIEW

[w chest pa]
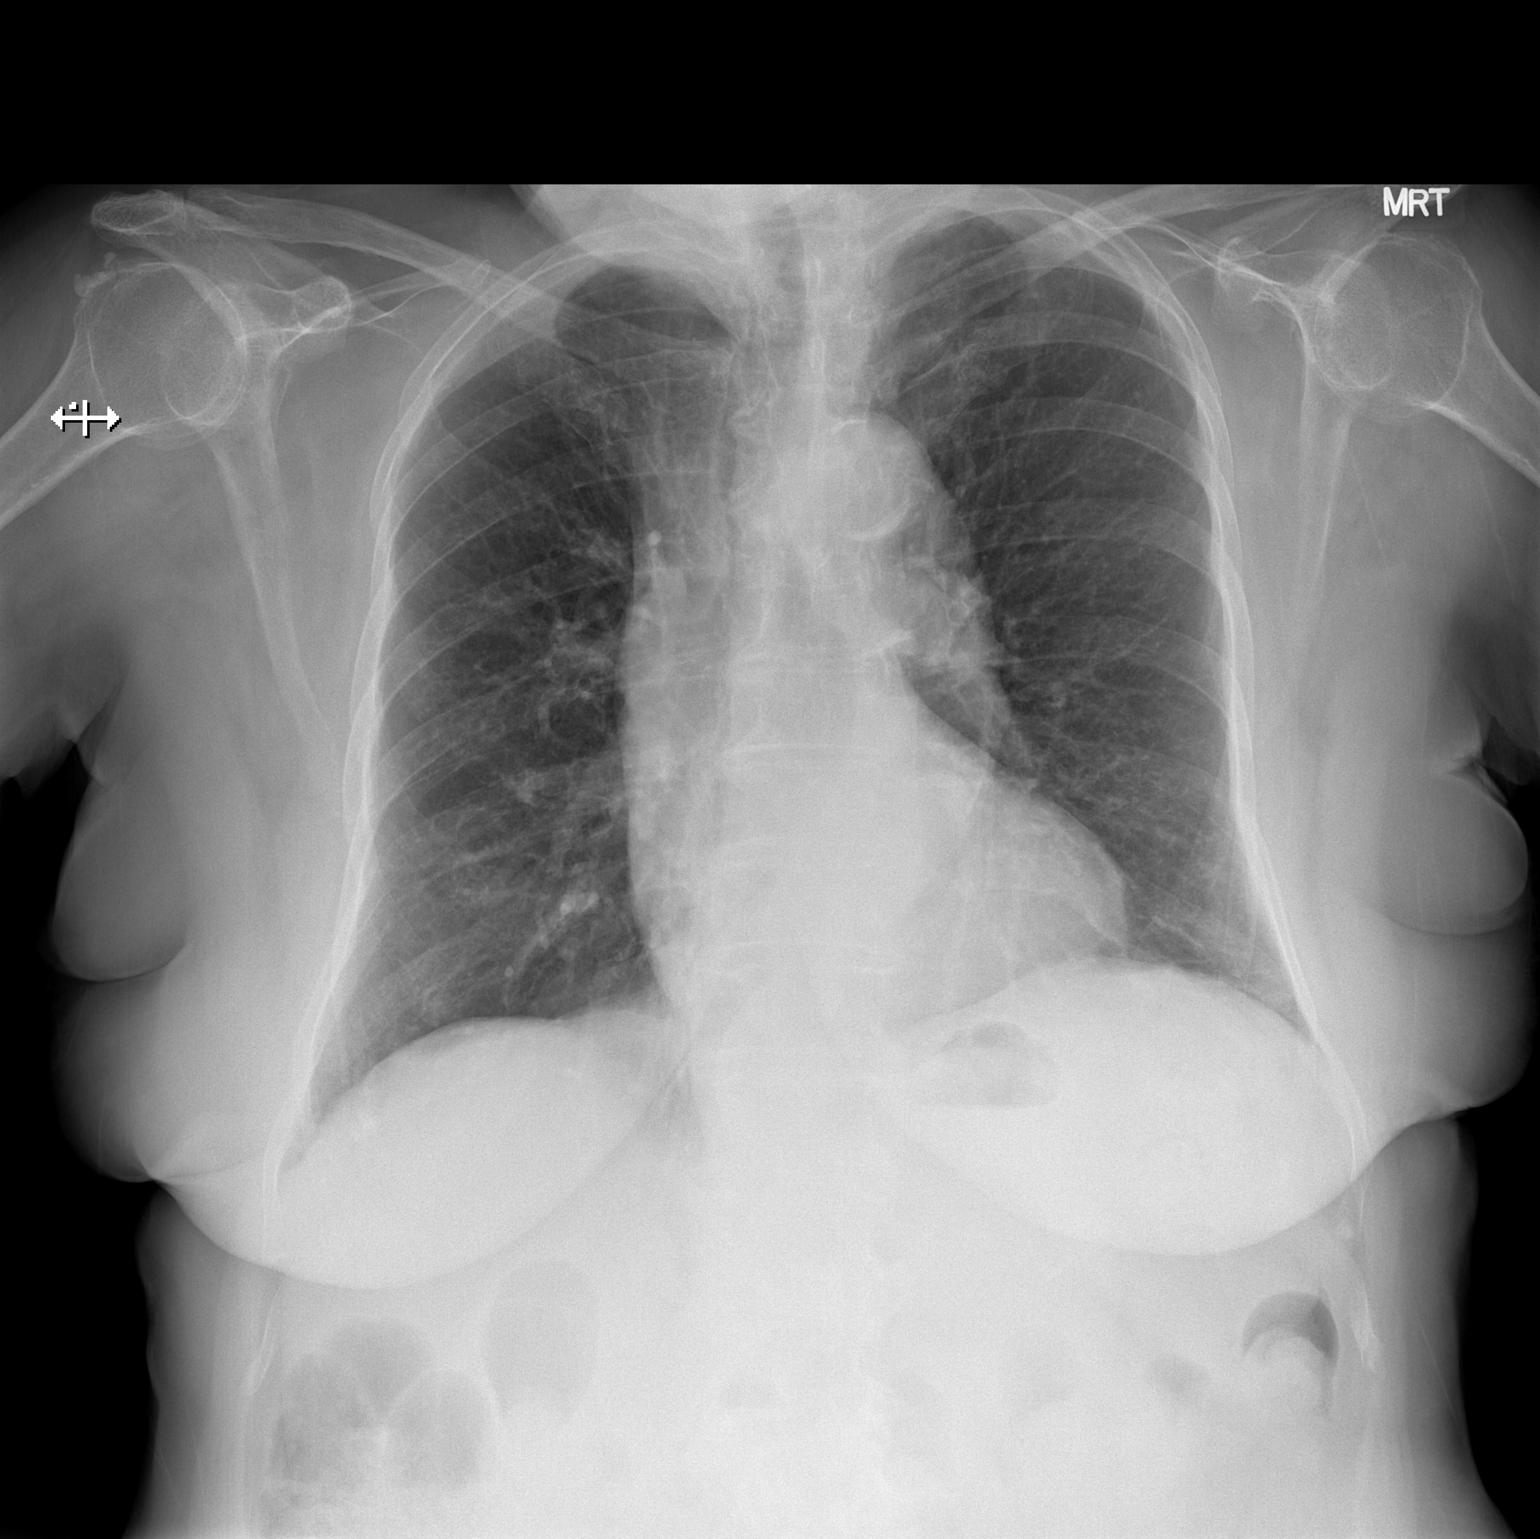

[w chest lat]
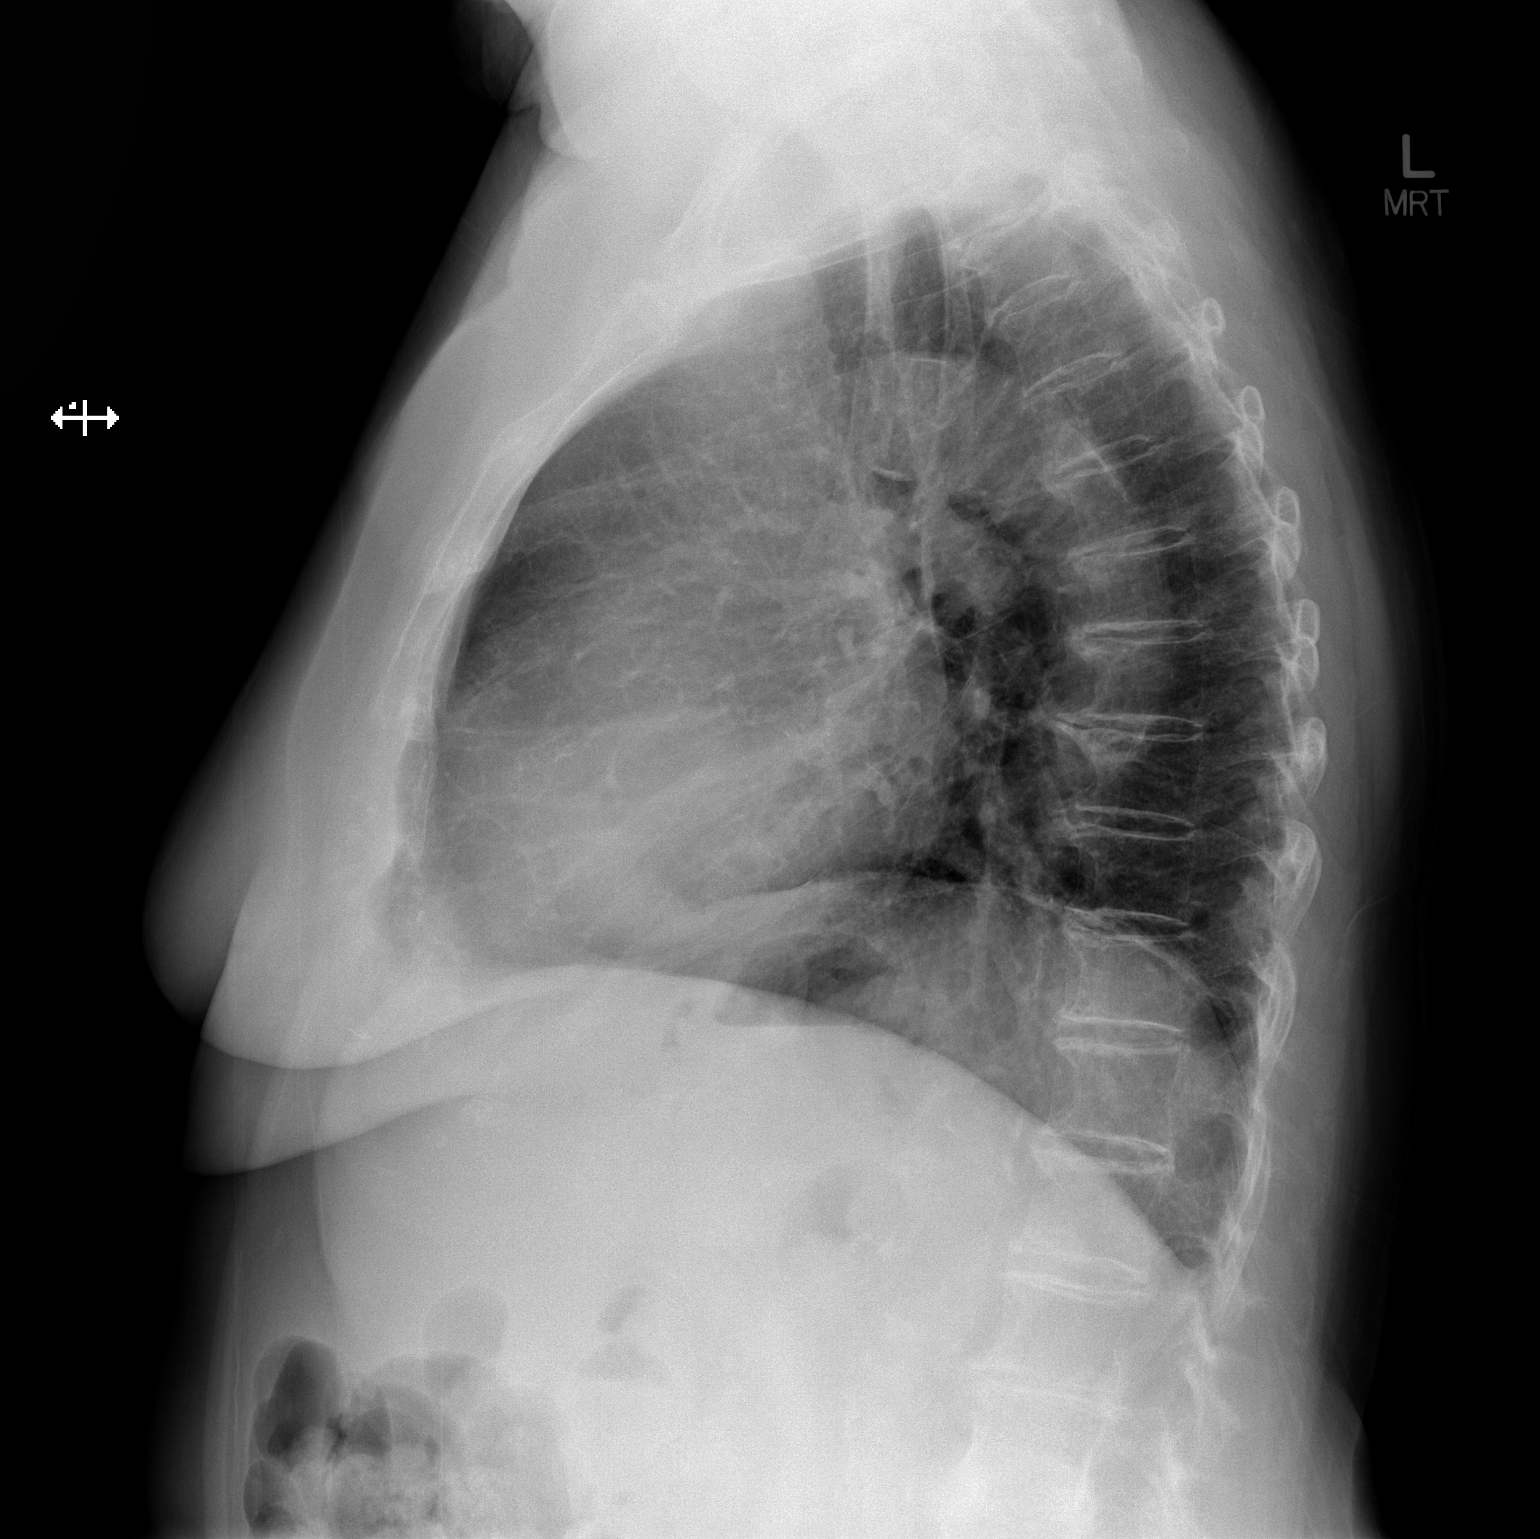

[2 of 2 positions shown; findings below may reference images not displayed]

FINDINGS: Stable cardiomediastinal silhouette with heart size and moderately
tortuous atherosclerotic thoracic aorta. No pneumothorax. No pleural
effusion. Mild left basilar scarring versus atelectasis. No
pulmonary edema. No acute consolidative airspace disease.
IMPRESSION: Mild left basilar scarring versus atelectasis. Otherwise no active
disease in the chest.

## 2018-06-18 IMAGING — RF DG C-ARM 61-120 MIN
1 series · 2 of 2 positions shown · non-contrast
Comparison: None.

CLINICAL DATA: Left hip replacement

EXAM:
OPERATIVE LEFT HIP WITH PELVIS; DG C-ARM 61-120 MIN

[Series 1: run · 2 of 2 slices shown]
[im 1/2]
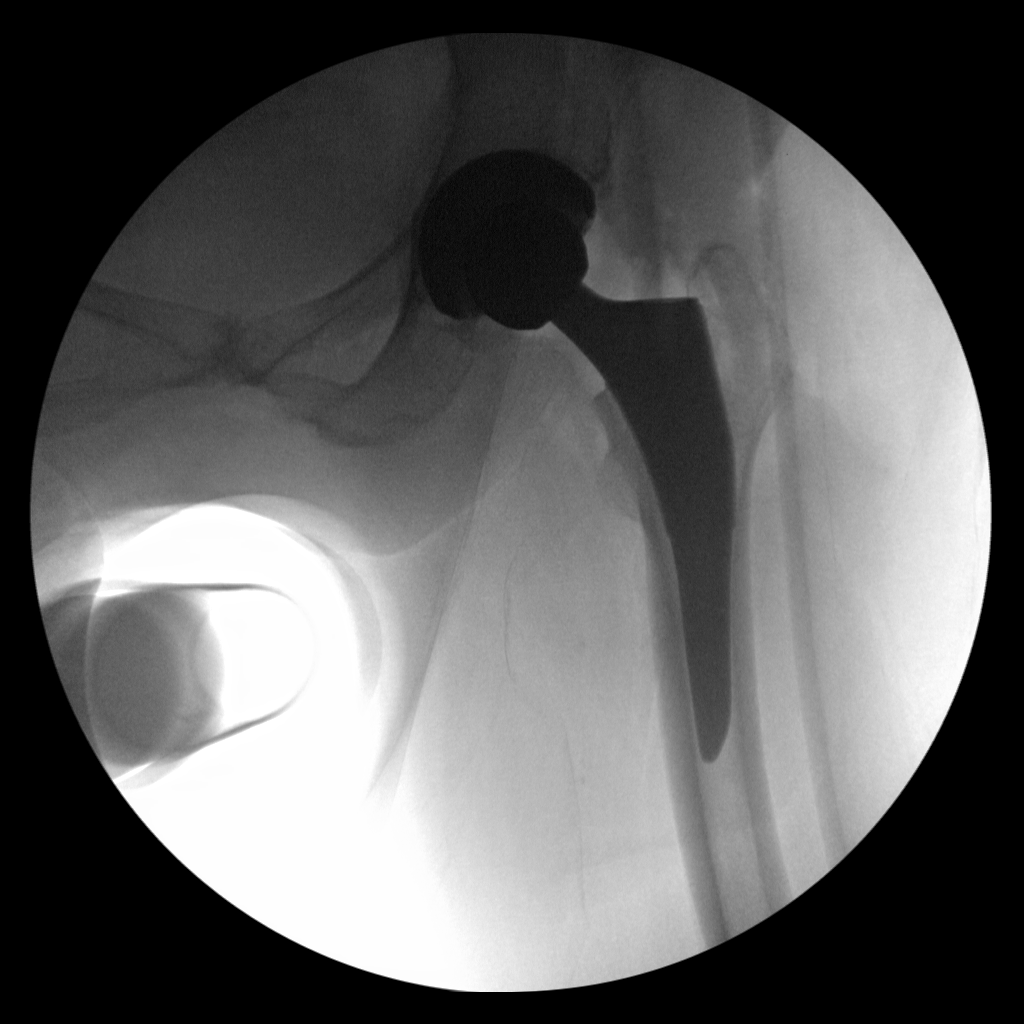
[im 2/2]
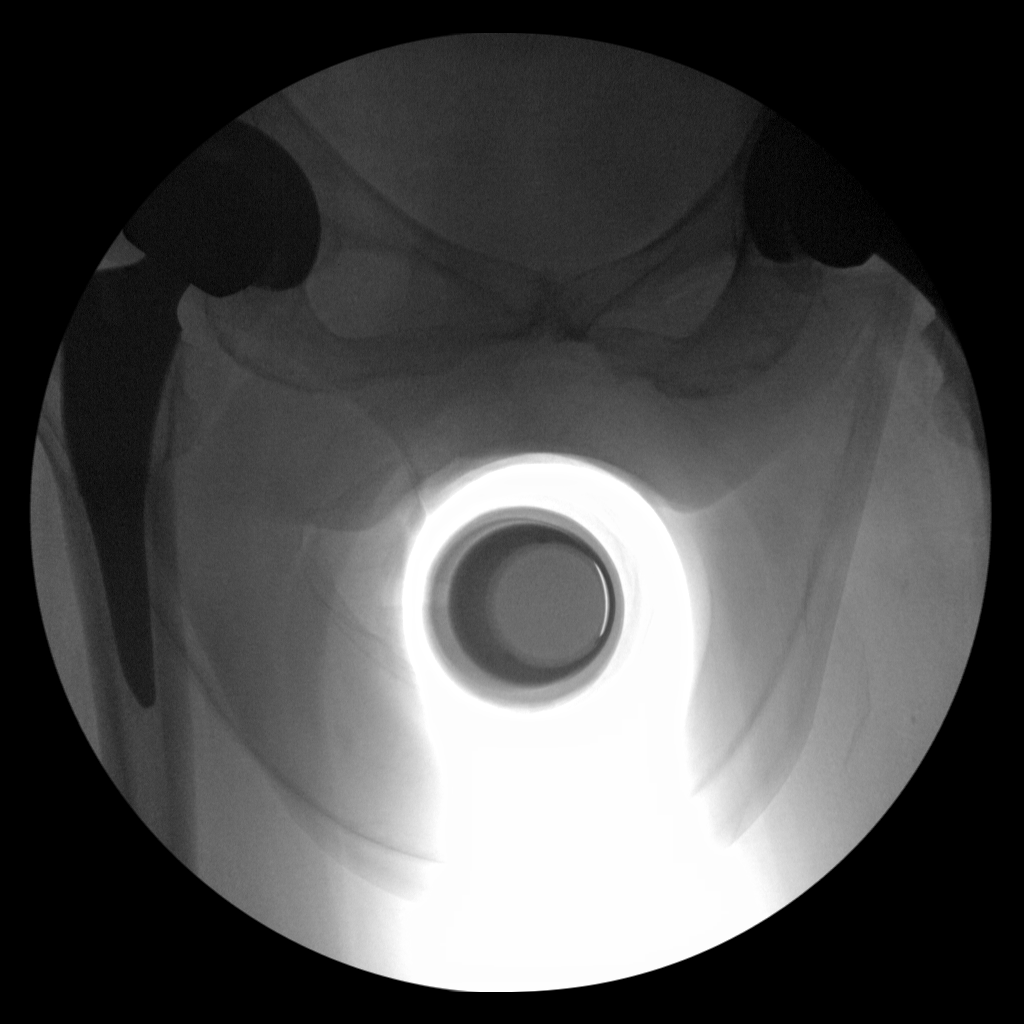

[2 of 2 positions shown; findings below may reference images not displayed]

FLUOROSCOPY TIME:  Radiation Exposure Index (as provided by the
fluoroscopic device): Not available

If the device does not provide the exposure index:

Fluoroscopy Time:  Not available

Number of Acquired Images:  2
FINDINGS: Left hip replacement is noted in satisfactory position. No acute
bony abnormality is noted.
IMPRESSION: Status post left hip replacement

## 2018-07-02 DIAGNOSIS — E7800 Pure hypercholesterolemia, unspecified: Secondary | ICD-10-CM | POA: Diagnosis not present

## 2018-07-02 DIAGNOSIS — Z6829 Body mass index (BMI) 29.0-29.9, adult: Secondary | ICD-10-CM | POA: Diagnosis not present

## 2018-07-02 DIAGNOSIS — E1169 Type 2 diabetes mellitus with other specified complication: Secondary | ICD-10-CM | POA: Diagnosis not present

## 2018-07-02 DIAGNOSIS — E663 Overweight: Secondary | ICD-10-CM | POA: Diagnosis not present

## 2018-07-02 DIAGNOSIS — I10 Essential (primary) hypertension: Secondary | ICD-10-CM | POA: Diagnosis not present

## 2018-10-01 DIAGNOSIS — E7800 Pure hypercholesterolemia, unspecified: Secondary | ICD-10-CM | POA: Diagnosis not present

## 2018-10-01 DIAGNOSIS — E663 Overweight: Secondary | ICD-10-CM | POA: Diagnosis not present

## 2018-10-01 DIAGNOSIS — K219 Gastro-esophageal reflux disease without esophagitis: Secondary | ICD-10-CM | POA: Diagnosis not present

## 2018-10-01 DIAGNOSIS — E1169 Type 2 diabetes mellitus with other specified complication: Secondary | ICD-10-CM | POA: Diagnosis not present

## 2018-10-01 DIAGNOSIS — M150 Primary generalized (osteo)arthritis: Secondary | ICD-10-CM | POA: Diagnosis not present

## 2018-10-01 DIAGNOSIS — Z6828 Body mass index (BMI) 28.0-28.9, adult: Secondary | ICD-10-CM | POA: Diagnosis not present

## 2018-10-01 DIAGNOSIS — E559 Vitamin D deficiency, unspecified: Secondary | ICD-10-CM | POA: Diagnosis not present

## 2018-10-01 DIAGNOSIS — E538 Deficiency of other specified B group vitamins: Secondary | ICD-10-CM | POA: Diagnosis not present

## 2018-10-01 DIAGNOSIS — Z79899 Other long term (current) drug therapy: Secondary | ICD-10-CM | POA: Diagnosis not present

## 2018-10-01 DIAGNOSIS — Z8673 Personal history of transient ischemic attack (TIA), and cerebral infarction without residual deficits: Secondary | ICD-10-CM | POA: Diagnosis not present

## 2018-10-01 DIAGNOSIS — I10 Essential (primary) hypertension: Secondary | ICD-10-CM | POA: Diagnosis not present

## 2018-10-01 DIAGNOSIS — Z0000 Encounter for general adult medical examination without abnormal findings: Secondary | ICD-10-CM | POA: Diagnosis not present

## 2018-10-13 DIAGNOSIS — E1169 Type 2 diabetes mellitus with other specified complication: Secondary | ICD-10-CM | POA: Diagnosis not present

## 2018-10-19 DIAGNOSIS — M79604 Pain in right leg: Secondary | ICD-10-CM | POA: Diagnosis not present

## 2018-10-19 DIAGNOSIS — M79669 Pain in unspecified lower leg: Secondary | ICD-10-CM | POA: Diagnosis not present

## 2018-10-19 DIAGNOSIS — M79605 Pain in left leg: Secondary | ICD-10-CM | POA: Diagnosis not present

## 2018-11-06 DIAGNOSIS — L570 Actinic keratosis: Secondary | ICD-10-CM | POA: Diagnosis not present

## 2018-11-06 DIAGNOSIS — L219 Seborrheic dermatitis, unspecified: Secondary | ICD-10-CM | POA: Diagnosis not present

## 2018-12-31 DIAGNOSIS — F419 Anxiety disorder, unspecified: Secondary | ICD-10-CM | POA: Diagnosis not present

## 2018-12-31 DIAGNOSIS — E7800 Pure hypercholesterolemia, unspecified: Secondary | ICD-10-CM | POA: Diagnosis not present

## 2018-12-31 DIAGNOSIS — E1169 Type 2 diabetes mellitus with other specified complication: Secondary | ICD-10-CM | POA: Diagnosis not present

## 2018-12-31 DIAGNOSIS — M150 Primary generalized (osteo)arthritis: Secondary | ICD-10-CM | POA: Diagnosis not present

## 2018-12-31 DIAGNOSIS — I10 Essential (primary) hypertension: Secondary | ICD-10-CM | POA: Diagnosis not present

## 2018-12-31 DIAGNOSIS — G2581 Restless legs syndrome: Secondary | ICD-10-CM | POA: Diagnosis not present

## 2019-02-17 DIAGNOSIS — H25813 Combined forms of age-related cataract, bilateral: Secondary | ICD-10-CM | POA: Diagnosis not present

## 2019-04-01 DIAGNOSIS — Z6826 Body mass index (BMI) 26.0-26.9, adult: Secondary | ICD-10-CM | POA: Diagnosis not present

## 2019-04-01 DIAGNOSIS — E7800 Pure hypercholesterolemia, unspecified: Secondary | ICD-10-CM | POA: Diagnosis not present

## 2019-04-01 DIAGNOSIS — I10 Essential (primary) hypertension: Secondary | ICD-10-CM | POA: Diagnosis not present

## 2019-04-01 DIAGNOSIS — E1169 Type 2 diabetes mellitus with other specified complication: Secondary | ICD-10-CM | POA: Diagnosis not present

## 2019-04-01 DIAGNOSIS — Z7902 Long term (current) use of antithrombotics/antiplatelets: Secondary | ICD-10-CM | POA: Diagnosis not present

## 2019-07-05 DIAGNOSIS — E7800 Pure hypercholesterolemia, unspecified: Secondary | ICD-10-CM | POA: Diagnosis not present

## 2019-07-05 DIAGNOSIS — E1169 Type 2 diabetes mellitus with other specified complication: Secondary | ICD-10-CM | POA: Diagnosis not present

## 2019-07-05 DIAGNOSIS — E663 Overweight: Secondary | ICD-10-CM | POA: Diagnosis not present

## 2019-07-05 DIAGNOSIS — Z6827 Body mass index (BMI) 27.0-27.9, adult: Secondary | ICD-10-CM | POA: Diagnosis not present

## 2019-07-05 DIAGNOSIS — I10 Essential (primary) hypertension: Secondary | ICD-10-CM | POA: Diagnosis not present

## 2019-08-29 ENCOUNTER — Emergency Department (HOSPITAL_COMMUNITY)
Admission: EM | Admit: 2019-08-29 | Discharge: 2019-08-29 | Discharge status: Against Medical Advice | Payer: PPO | Attending: Emergency Medicine | Admitting: Emergency Medicine

## 2019-08-29 DIAGNOSIS — Z5321 Procedure and treatment not carried out due to patient leaving prior to being seen by health care provider: Secondary | ICD-10-CM | POA: Insufficient documentation

## 2019-08-29 DIAGNOSIS — Z043 Encounter for examination and observation following other accident: Secondary | ICD-10-CM | POA: Diagnosis present

## 2019-08-29 NOTE — ED Notes (Signed)
Pt stated that she is leaving and that she will follow up in the morning with her PCP.

## 2019-08-30 DIAGNOSIS — S0990XA Unspecified injury of head, initial encounter: Secondary | ICD-10-CM | POA: Diagnosis not present

## 2019-08-30 DIAGNOSIS — Z79899 Other long term (current) drug therapy: Secondary | ICD-10-CM | POA: Diagnosis not present

## 2019-08-30 DIAGNOSIS — R42 Dizziness and giddiness: Secondary | ICD-10-CM | POA: Diagnosis not present

## 2019-08-30 DIAGNOSIS — Z9181 History of falling: Secondary | ICD-10-CM | POA: Diagnosis not present

## 2019-09-14 DIAGNOSIS — R2689 Other abnormalities of gait and mobility: Secondary | ICD-10-CM | POA: Diagnosis not present

## 2019-09-14 DIAGNOSIS — M6281 Muscle weakness (generalized): Secondary | ICD-10-CM | POA: Diagnosis not present

## 2019-09-16 DIAGNOSIS — R2689 Other abnormalities of gait and mobility: Secondary | ICD-10-CM | POA: Diagnosis not present

## 2019-09-16 DIAGNOSIS — M6281 Muscle weakness (generalized): Secondary | ICD-10-CM | POA: Diagnosis not present

## 2019-09-20 DIAGNOSIS — R2689 Other abnormalities of gait and mobility: Secondary | ICD-10-CM | POA: Diagnosis not present

## 2019-09-20 DIAGNOSIS — M6281 Muscle weakness (generalized): Secondary | ICD-10-CM | POA: Diagnosis not present

## 2019-09-22 DIAGNOSIS — M6281 Muscle weakness (generalized): Secondary | ICD-10-CM | POA: Diagnosis not present

## 2019-09-22 DIAGNOSIS — R2689 Other abnormalities of gait and mobility: Secondary | ICD-10-CM | POA: Diagnosis not present

## 2019-09-27 DIAGNOSIS — M6281 Muscle weakness (generalized): Secondary | ICD-10-CM | POA: Diagnosis not present

## 2019-09-27 DIAGNOSIS — R2689 Other abnormalities of gait and mobility: Secondary | ICD-10-CM | POA: Diagnosis not present

## 2019-09-30 DIAGNOSIS — R2689 Other abnormalities of gait and mobility: Secondary | ICD-10-CM | POA: Diagnosis not present

## 2019-09-30 DIAGNOSIS — M6281 Muscle weakness (generalized): Secondary | ICD-10-CM | POA: Diagnosis not present

## 2019-10-04 DIAGNOSIS — Z9181 History of falling: Secondary | ICD-10-CM | POA: Diagnosis not present

## 2019-10-04 DIAGNOSIS — F419 Anxiety disorder, unspecified: Secondary | ICD-10-CM | POA: Diagnosis not present

## 2019-10-04 DIAGNOSIS — I10 Essential (primary) hypertension: Secondary | ICD-10-CM | POA: Diagnosis not present

## 2019-10-04 DIAGNOSIS — Z0000 Encounter for general adult medical examination without abnormal findings: Secondary | ICD-10-CM | POA: Diagnosis not present

## 2019-10-04 DIAGNOSIS — E559 Vitamin D deficiency, unspecified: Secondary | ICD-10-CM | POA: Diagnosis not present

## 2019-10-04 DIAGNOSIS — G2581 Restless legs syndrome: Secondary | ICD-10-CM | POA: Diagnosis not present

## 2019-10-04 DIAGNOSIS — K219 Gastro-esophageal reflux disease without esophagitis: Secondary | ICD-10-CM | POA: Diagnosis not present

## 2019-10-04 DIAGNOSIS — E7800 Pure hypercholesterolemia, unspecified: Secondary | ICD-10-CM | POA: Diagnosis not present

## 2019-10-04 DIAGNOSIS — Z8673 Personal history of transient ischemic attack (TIA), and cerebral infarction without residual deficits: Secondary | ICD-10-CM | POA: Diagnosis not present

## 2019-10-04 DIAGNOSIS — E538 Deficiency of other specified B group vitamins: Secondary | ICD-10-CM | POA: Diagnosis not present

## 2019-10-04 DIAGNOSIS — E1169 Type 2 diabetes mellitus with other specified complication: Secondary | ICD-10-CM | POA: Diagnosis not present

## 2019-10-05 DIAGNOSIS — M6281 Muscle weakness (generalized): Secondary | ICD-10-CM | POA: Diagnosis not present

## 2019-10-05 DIAGNOSIS — R2689 Other abnormalities of gait and mobility: Secondary | ICD-10-CM | POA: Diagnosis not present

## 2019-10-08 DIAGNOSIS — R2689 Other abnormalities of gait and mobility: Secondary | ICD-10-CM | POA: Diagnosis not present

## 2019-10-08 DIAGNOSIS — M6281 Muscle weakness (generalized): Secondary | ICD-10-CM | POA: Diagnosis not present

## 2019-10-12 ENCOUNTER — Encounter: Payer: Self-pay | Admitting: Cardiology

## 2019-10-12 ENCOUNTER — Ambulatory Visit: Payer: PPO | Admitting: Cardiology

## 2019-10-12 ENCOUNTER — Other Ambulatory Visit: Payer: Self-pay

## 2019-10-12 VITALS — BP 112/50 | HR 67 | Ht 62.0 in | Wt 136.0 lb

## 2019-10-12 DIAGNOSIS — E782 Mixed hyperlipidemia: Secondary | ICD-10-CM | POA: Diagnosis not present

## 2019-10-12 DIAGNOSIS — Z8673 Personal history of transient ischemic attack (TIA), and cerebral infarction without residual deficits: Secondary | ICD-10-CM | POA: Diagnosis not present

## 2019-10-12 DIAGNOSIS — R9431 Abnormal electrocardiogram [ECG] [EKG]: Secondary | ICD-10-CM | POA: Diagnosis not present

## 2019-10-12 DIAGNOSIS — I10 Essential (primary) hypertension: Secondary | ICD-10-CM

## 2019-10-12 DIAGNOSIS — R0989 Other specified symptoms and signs involving the circulatory and respiratory systems: Secondary | ICD-10-CM | POA: Diagnosis not present

## 2019-10-12 DIAGNOSIS — Z01810 Encounter for preprocedural cardiovascular examination: Secondary | ICD-10-CM | POA: Diagnosis not present

## 2019-10-12 NOTE — Progress Notes (Signed)
Cardiology Office Note:    Date:  10/12/2019   ID:  Kimberly Lester, DOB 1931-02-26, MRN [id number]  PCP:  Greig Right, MD  Cardiologist:  Jenean Lindau, MD   Referring MD: Greig Right, MD    ASSESSMENT:    1. Essential hypertension   2. Preoperative cardiovascular examination   3. History of stroke   4. Bilateral carotid bruits   5. Mixed hyperlipidemia   6. Abnormal EKG    PLAN:    In order of problems listed above:  1. Preoperative cardiovascular assessment: Patient has no history of coronary artery disease.  EKG is abnormal and suggest the possibility of old myocardial infarction.  In view of this we will do a Lexiscan sestamibi.  Patient does not remember any history of cardiac issues like coronary artery disease.  She is asymptomatic but for obvious reasons leads a sedentary lifestyle.  This will help [place] assess her preoperative cardiovascular risks. 2. Essential hypertension: Blood pressure stable 3. Mixed dyslipidemia: Managed by primary care physician. 4. History of stroke and she is on Plavix for the same reason.  This is managed by primary care. 5. Bilateral carotid bruit: Patient will undergo bilateral ultrasounds to assess this. 6. If the aforementioned tests are negative she is not at high risk for coronary events during the aforementioned surgery.  Meticulous hemodynamic monitoring and continued perioperative beta-blockade will further reduce the risk of coronary events.  She will be seen in follow-up appointment in 6 months or earlier if she has any concerns.   Medication Adjustments/Labs and Tests Ordered: Current medicines are reviewed at length with the patient today.  Concerns regarding medicines are outlined above.  No orders of the defined types were placed in this encounter.  No orders of the defined types were placed in this encounter.    History of Present Illness:    Kimberly Lester is a 86 y.o. female who is being seen today for the evaluation  of preoperative cardiovascular assessment at the request of Greig Right, MD.  Patient is a pleasant 86 year old female.  She has past medical history of essential hypertension dyslipidemia and history of strokes.  She is planning to undergo knee replacement surgery and is sent here for preop assessment.  Obviously she leads a sedentary lifestyle.  She had history of strokes.  She denies any chest pain orthopnea or PND.  Her daughter accompanies her for this visit.  She is very supportive.  At the time of my evaluation, the patient is alert awake oriented and in no distress.  Past Medical History:  Diagnosis Date  . GERD (gastroesophageal reflux disease)   . Hypercholesterolemia   . Hypertension   . Osteoarthritis   . Restless leg syndrome   . Stroke ([place]) 06/16/2011   some balance issues, finger numbness  . TIA (transient ischemic attack) 07/20/2011    Past Surgical History:  Procedure Laterality Date  . JOINT REPLACEMENT    . TOTAL HIP ARTHROPLASTY Right 07/12/2010   at [place]  . TOTAL HIP ARTHROPLASTY Left 06/25/2017  . TOTAL HIP ARTHROPLASTY Left 06/25/2017   Procedure: TOTAL HIP ARTHROPLASTY ANTERIOR APPROACH;  Surgeon: Frederik Pear, MD;  Location: [facility];  Service: Orthopedics;  Laterality: Left;    Current Medications: Current Meds  Medication Sig  . acetaminophen (TYLENOL) 500 MG tablet Take 500 mg by mouth every 6 (six) hours as needed for mild pain.   Marland Kitchen amLODipine (NORVASC) 5 MG tablet Take 5 mg by mouth  daily.  . aspirin EC 325 MG tablet Take 1 tablet (325 mg total) by mouth 2 (two) times daily.  . cloNIDine (CATAPRES) 0.2 MG tablet Take by mouth.  . clopidogrel (PLAVIX) 75 MG tablet Take 1 tablet (75 mg total) by mouth daily.  . DULoxetine (CYMBALTA) 30 MG capsule Take 30 mg by mouth 2 (two) times daily.  Marland Kitchen gabapentin (NEURONTIN) 100 MG capsule Take 200 mg by mouth at bedtime.  . hydrochlorothiazide (HYDRODIURIL) 25 MG tablet Take 25 mg by mouth daily.    . lansoprazole (PREVACID) 30 MG capsule Take 30 mg by mouth daily at 12 noon.  Marland Kitchen lisinopril (PRINIVIL,ZESTRIL) 20 MG tablet Take 20 mg by mouth daily.  . meclizine (ANTIVERT) 25 MG tablet Take 25 mg by mouth 3 (three) times daily as needed for dizziness.   . meloxicam (MOBIC) 7.5 MG tablet Take by mouth.  Marland Kitchen methyl salicylate-menthol (BENGAY) ointment Apply 1 application topically as needed for pain. For muscle/joint pain   . metoprolol succinate (TOPROL-XL) 50 MG 24 hr tablet Take 1 tablet (50 mg total) by mouth 2 (two) times daily. Take with or immediately following a meal. (Patient taking differently: Take 50-100 mg by mouth 2 (two) times daily. TAKE 1 TABLET (50 MG) BY MOUTH IN THE MORNING & TAKE 2 TABLETS (100 MG) BY MOUTH IN THE EVENING.)  . psyllium (METAMUCIL SMOOTH TEXTURE) 28 % packet Take 1 packet by mouth daily.  Marland Kitchen rOPINIRole (REQUIP) 1 MG tablet Take 2 mg by mouth at bedtime.  . rosuvastatin (CRESTOR) 40 MG tablet Take 40 mg by mouth every evening.  Marland Kitchen tiZANidine (ZANAFLEX) 2 MG tablet Take 1 tablet (2 mg total) by mouth every 6 (six) hours as needed for muscle spasms.  . traMADol (ULTRAM) 50 MG tablet Take 50 mg by mouth every 6 (six) hours as needed for moderate pain.      Allergies:   Lorazepam and Percocet [oxycodone-acetaminophen]   Social History   Socioeconomic History  . Marital status: Married    Spouse name: Not on file  . Number of children: Not on file  . Years of education: Not on file  . Highest education level: Not on file  Occupational History  . Not on file  Tobacco Use  . Smoking status: Never Smoker  . Smokeless tobacco: Never Used  Substance and Sexual Activity  . Alcohol use: No  . Drug use: No  . Sexual activity: Not on file  Other Topics Concern  . Not on file  Social History Narrative  . Not on file   Social Determinants of Health   Financial Resource Strain:   . Difficulty of Paying Living Expenses:   Food Insecurity:   . Worried About  Charity fundraiser in the Last Year:   . Arboriculturist in the Last Year:   Transportation Needs:   . Film/video editor (Medical):   Marland Kitchen Lack of Transportation (Non-Medical):   Physical Activity:   . Days of Exercise per Week:   . Minutes of Exercise per Session:   Stress:   . Feeling of Stress :   Social Connections:   . Frequency of Communication with Friends and Family:   . Frequency of Social Gatherings with Friends and Family:   . Attends Religious Services:   . Active Member of Clubs or Organizations:   . Attends Archivist Meetings:   Marland Kitchen Marital Status:      Family History: The patient's family history includes  COPD in her father; Coronary artery disease in her father; Tuberculosis in her mother.  ROS:   Please see the history of present illness.    All other systems reviewed and are negative.  EKGs/Labs/Other Studies Reviewed:    The following studies were reviewed today: EKG reveals sinus rhythm and nonspecific ST changes first-degree AV block and anterior wall myocardial infarction of undetermined age.   Recent Labs: No results found for requested labs within last 8760 hours.  Recent Lipid Panel No results found for: CHOL, TRIG, HDL, CHOLHDL, VLDL, LDLCALC, LDLDIRECT  Physical Exam:    VS:  BP (!) 112/50   Pulse 67   Ht 5\' 2"  (1.575 m)   Wt 136 lb (61.7 kg)   SpO2 90%   BMI 24.87 kg/m     Wt Readings from Last 3 Encounters:  10/12/19 136 lb (61.7 kg)  06/25/17 160 lb (72.6 kg)  06/06/17 160 lb 6.4 oz (72.8 kg)     GEN: Patient is in no acute distress HEENT: Normal NECK: No JVD; bilateral carotid bruits LYMPHATICS: No lymphadenopathy CARDIAC: S1 S2 regular, 2/6 systolic murmur at the apex. RESPIRATORY:  Clear to auscultation without rales, wheezing or rhonchi  ABDOMEN: Soft, non-tender, non-distended MUSCULOSKELETAL:  No edema; No deformity  SKIN: Warm and dry NEUROLOGIC:  Alert and oriented x 3 PSYCHIATRIC:  Normal affect     Signed, Jenean Lindau, MD  10/12/2019 2:17 PM    [facility]

## 2019-10-12 NOTE — Patient Instructions (Signed)
Medication Instructions:  Your physician has recommended you make the following change in your medication: Decrease you aspirin to 81 mg daily.  *If you need a refill on your cardiac medications before your next appointment, please call your pharmacy*   Lab Work: None ordered If you have labs (blood work) drawn today and your tests are completely normal, you will receive your results only by: Marland Kitchen MyChart Message (if you have MyChart) OR . A paper copy in the mail If you have any lab test that is abnormal or we need to change your treatment, we will call you to review the results.   Testing/Procedures: Your physician has requested that you have an echocardiogram. Echocardiography is a painless test that uses sound waves to create images of your heart. It provides your doctor with information about the size and shape of your heart and how well your heart's chambers and valves are working. This procedure takes approximately one hour. There are no restrictions for this procedure.  Your physician has requested that you have a lexiscan myoview. For further information please visit [email or web address]. Please follow instruction sheet, as given.  The test will take approximately 3 to 4 hours to complete; you may bring reading material.  If someone comes with you to your appointment, they will need to remain in the main lobby due to limited space in the testing area.  How to prepare for your Myocardial Perfusion Test: . Do not eat or drink 3 hours prior to your test, except you may have water. . Do not consume products containing caffeine (regular or decaffeinated) 12 hours prior to your test. (ex: coffee, chocolate, sodas, tea). . Do bring a list of your current medications with you.  If not listed below, you may take your medications as normal. . Do wear comfortable clothes (no dresses or overalls) and walking shoes, tennis shoes preferred (No heels or open toe shoes are allowed). . Do NOT wear  cologne, perfume, aftershave, or lotions (deodorant is allowed). . If these instructions are not followed, your test will have to be rescheduled. Your physician has requested that you have a carotid ultrasound. This test is an ultrasound of the carotid arteries in your neck. It looks at blood flow through these arteries that supply the brain with blood. Allow one hour for this exam. There are no restrictions or special instructions.    Follow-Up: At [facility], you and your health needs are our priority.  As part of our continuing mission to provide you with exceptional heart care, we have created designated Provider Care Teams.  These Care Teams include your primary Cardiologist (physician) and Advanced Practice Providers (APPs -  Physician Assistants and Nurse Practitioners) who all work together to provide you with the care you need, when you need it.  We recommend signing up for the patient portal called "MyChart".  Sign up information is provided on this After Visit Summary.  MyChart is used to connect with patients for Virtual Visits (Telemedicine).  Patients are able to view lab/test results, encounter notes, upcoming appointments, etc.  Non-urgent messages can be sent to your provider as well.   To learn more about what you can do with MyChart, go to [email or web address].    Your next appointment:   6 month(s)  The format for your next appointment:   In Person  Provider:   Jyl Heinz, MD   Other Instructions  Echocardiogram An echocardiogram is a procedure that uses painless sound waves (ultrasound) to produce an image  of the heart. Images from an echocardiogram can provide important information about:  Signs of coronary artery disease (CAD).  Aneurysm detection. An aneurysm is a weak or damaged part of an artery wall that bulges out from the normal force of blood pumping through the body.  Heart size and shape. Changes in the size or shape of the heart can be  associated with certain conditions, including heart failure, aneurysm, and CAD.  Heart muscle function.  Heart valve function.  Signs of a past heart attack.  Fluid buildup around the heart.  Thickening of the heart muscle.  A tumor or infectious growth around the heart valves. Tell a health care provider about:  Any allergies you have.  All medicines you are taking, including vitamins, herbs, eye drops, creams, and over-the-counter medicines.  Any blood disorders you have.  Any surgeries you have had.  Any medical conditions you have.  Whether you are pregnant or may be pregnant. What are the risks? Generally, this is a safe procedure. However, problems may occur, including:  Allergic reaction to dye (contrast) that may be used during the procedure. What happens before the procedure? No specific preparation is needed. You may eat and drink normally. What happens during the procedure?   An IV tube may be inserted into one of your veins.  You may receive contrast through this tube. A contrast is an injection that improves the quality of the pictures from your heart.  A gel will be applied to your chest.  A wand-like tool (transducer) will be moved over your chest. The gel will help to transmit the sound waves from the transducer.  The sound waves will harmlessly bounce off of your heart to allow the heart images to be captured in real-time motion. The images will be recorded on a computer. The procedure may vary among health care providers and hospitals. What happens after the procedure?  You may return to your normal, everyday life, including diet, activities, and medicines, unless your health care provider tells you not to do that. Summary  An echocardiogram is a procedure that uses painless sound waves (ultrasound) to produce an image of the heart.  Images from an echocardiogram can provide important information about the size and shape of your heart, heart  muscle function, heart valve function, and fluid buildup around your heart.  You do not need to do anything to prepare before this procedure. You may eat and drink normally.  After the echocardiogram is completed, you may return to your normal, everyday life, unless your health care provider tells you not to do that. This information is not intended to replace advice given to you by your health care provider. Make sure you discuss any questions you have with your health care provider. Document Revised: 08/27/2018 Document Reviewed: 06/08/2016 Elsevier Patient Education  [place].

## 2019-10-13 ENCOUNTER — Telehealth: Payer: Self-pay | Admitting: *Deleted

## 2019-10-13 DIAGNOSIS — R2689 Other abnormalities of gait and mobility: Secondary | ICD-10-CM | POA: Diagnosis not present

## 2019-10-13 DIAGNOSIS — M6281 Muscle weakness (generalized): Secondary | ICD-10-CM | POA: Diagnosis not present

## 2019-10-13 NOTE — Telephone Encounter (Signed)
Left message on voicemail per DPR in reference to upcoming appointment scheduled on 10/20/2019 at 0815 with detailed instructions given per Myocardial Perfusion Study Information Sheet for the test. LM to arrive 15 minutes early, and that it is imperative to arrive on time for appointment to keep from having the test rescheduled. If you need to cancel or reschedule your appointment, please call the office within 24 hours of your appointment. Failure to do so may result in a cancellation of your appointment, and a $50 no show fee. Phone number given for call back for any questions.   No mychart available.[place], Ranae Palms

## 2019-10-20 ENCOUNTER — Other Ambulatory Visit: Payer: Self-pay

## 2019-10-20 ENCOUNTER — Ambulatory Visit (INDEPENDENT_AMBULATORY_CARE_PROVIDER_SITE_OTHER): Payer: PPO

## 2019-10-20 VITALS — Ht 62.0 in | Wt 136.0 lb

## 2019-10-20 DIAGNOSIS — Z8673 Personal history of transient ischemic attack (TIA), and cerebral infarction without residual deficits: Secondary | ICD-10-CM

## 2019-10-20 DIAGNOSIS — Z01818 Encounter for other preprocedural examination: Secondary | ICD-10-CM

## 2019-10-20 DIAGNOSIS — R9431 Abnormal electrocardiogram [ECG] [EKG]: Secondary | ICD-10-CM

## 2019-10-20 DIAGNOSIS — Z01810 Encounter for preprocedural cardiovascular examination: Secondary | ICD-10-CM | POA: Diagnosis not present

## 2019-10-20 MED ORDER — REGADENOSON 0.4 MG/5ML IV SOLN
0.4000 mg | Freq: Once | INTRAVENOUS | Status: AC
  Administered 2019-10-20: 0.4 mg via INTRAVENOUS

## 2019-10-20 MED ORDER — TECHNETIUM TC 99M TETROFOSMIN IV KIT
10.1000 | PACK | Freq: Once | INTRAVENOUS | Status: AC | PRN
  Administered 2019-10-20: 10.1 via INTRAVENOUS

## 2019-10-20 MED ORDER — TECHNETIUM TC 99M TETROFOSMIN IV KIT
31.1000 | PACK | Freq: Once | INTRAVENOUS | Status: AC | PRN
  Administered 2019-10-20: 31.1 via INTRAVENOUS

## 2019-10-21 LAB — MYOCARDIAL PERFUSION IMAGING
LV dias vol: 61 mL (ref 46–106)
LV sys vol: 18 mL
Peak HR: 76 {beats}/min
Rest HR: 60 {beats}/min
SDS: 2
SRS: 5
SSS: 7
TID: 0.94

## 2019-10-29 ENCOUNTER — Other Ambulatory Visit: Payer: Self-pay

## 2019-10-29 ENCOUNTER — Ambulatory Visit (INDEPENDENT_AMBULATORY_CARE_PROVIDER_SITE_OTHER): Payer: PPO

## 2019-10-29 DIAGNOSIS — Z01810 Encounter for preprocedural cardiovascular examination: Secondary | ICD-10-CM | POA: Diagnosis not present

## 2019-10-29 DIAGNOSIS — R0989 Other specified symptoms and signs involving the circulatory and respiratory systems: Secondary | ICD-10-CM | POA: Diagnosis not present

## 2019-10-29 NOTE — Progress Notes (Signed)
Carotid duplex exam performed  Jimmy Izabela Ow RDCS, RVT 

## 2019-10-29 NOTE — Progress Notes (Signed)
Complete echocardiogram has been performed.  Jimmy Reeshemah Nazaryan RDCS, RVT 

## 2019-12-14 DIAGNOSIS — R42 Dizziness and giddiness: Secondary | ICD-10-CM | POA: Diagnosis not present

## 2019-12-28 ENCOUNTER — Telehealth: Payer: Self-pay

## 2019-12-28 DIAGNOSIS — M25462 Effusion, left knee: Secondary | ICD-10-CM | POA: Diagnosis not present

## 2019-12-28 DIAGNOSIS — M21162 Varus deformity, not elsewhere classified, left knee: Secondary | ICD-10-CM | POA: Diagnosis not present

## 2019-12-28 DIAGNOSIS — M1712 Unilateral primary osteoarthritis, left knee: Secondary | ICD-10-CM | POA: Diagnosis not present

## 2019-12-28 DIAGNOSIS — M25562 Pain in left knee: Secondary | ICD-10-CM | POA: Diagnosis not present

## 2019-12-28 NOTE — Telephone Encounter (Signed)
   Primary Cardiologist: Dr Geraldo Pitter  Chart reviewed and patient contacted today by phone as part of pre-operative protocol coverage. Given past medical history and time since last visit, based on ACC/AHA guidelines, Kimberly Lester would be at acceptable risk for the planned procedure without further cardiovascular testing.   She is on Plavix and aspirin for a history of stroke and I asked the patient to check with her PCP or neurologist about holding this.   I will route this recommendation to the requesting party via Epic fax function and remove from pre-op pool.  Please call with questions.  Kerin Ransom, PA-C 12/28/2019, 2:13 PM

## 2019-12-28 NOTE — Telephone Encounter (Signed)
° °  [facility] Pre-operative Risk Assessment    HEARTCARE STAFF: - Please ensure there is not already an duplicate clearance open for this procedure. - Under Visit Info/Reason for Call, type in Other and utilize the format Clearance MM/DD/YY or Clearance TBD. Do not use dashes or single digits. - If request is for dental extraction, please clarify the # of teeth to be extracted.  Request for surgical clearance:  1. What type of surgery is being performed? Knee Replacement    2. When is this surgery scheduled? TBD   3. What type of clearance is required (medical clearance vs. Pharmacy clearance to hold med vs. Both)? Medical  4. Are there any medications that need to be held prior to surgery and how long? Pt is on Plavix but no specific meds on the form.   5. Practice name and name of physician performing surgery? [facility]   Carlyon Shadow, PA-C   6. What is the office phone number? [phone]   7.   What is the office fax number? [phone]  8.   Anesthesia type (None, local, MAC, general) ? Not included   Truddie Hidden 12/28/2019, 1:52 PM  _________________________________________________________________   (provider comments below)

## 2019-12-30 DIAGNOSIS — Z01810 Encounter for preprocedural cardiovascular examination: Secondary | ICD-10-CM | POA: Diagnosis not present

## 2019-12-30 DIAGNOSIS — E7800 Pure hypercholesterolemia, unspecified: Secondary | ICD-10-CM | POA: Diagnosis not present

## 2019-12-30 DIAGNOSIS — M179 Osteoarthritis of knee, unspecified: Secondary | ICD-10-CM | POA: Diagnosis not present

## 2019-12-30 DIAGNOSIS — Z6827 Body mass index (BMI) 27.0-27.9, adult: Secondary | ICD-10-CM | POA: Diagnosis not present

## 2019-12-30 DIAGNOSIS — I10 Essential (primary) hypertension: Secondary | ICD-10-CM | POA: Diagnosis not present

## 2019-12-30 DIAGNOSIS — E663 Overweight: Secondary | ICD-10-CM | POA: Diagnosis not present

## 2019-12-30 DIAGNOSIS — K219 Gastro-esophageal reflux disease without esophagitis: Secondary | ICD-10-CM | POA: Diagnosis not present

## 2020-01-10 ENCOUNTER — Telehealth: Payer: Self-pay | Admitting: Cardiology

## 2020-01-10 NOTE — Telephone Encounter (Signed)
Follow up:    Patient daughter calling to see if the medical clearance form was signed and sent to Dr. Lorre Nick. Please call patient daughter.

## 2020-01-10 NOTE — Telephone Encounter (Signed)
LVMTCB

## 2020-01-13 ENCOUNTER — Telehealth: Payer: Self-pay

## 2020-01-13 DIAGNOSIS — I10 Essential (primary) hypertension: Secondary | ICD-10-CM | POA: Diagnosis not present

## 2020-01-13 DIAGNOSIS — E1121 Type 2 diabetes mellitus with diabetic nephropathy: Secondary | ICD-10-CM | POA: Diagnosis not present

## 2020-01-13 DIAGNOSIS — E669 Obesity, unspecified: Secondary | ICD-10-CM | POA: Diagnosis not present

## 2020-01-13 DIAGNOSIS — M179 Osteoarthritis of knee, unspecified: Secondary | ICD-10-CM | POA: Diagnosis not present

## 2020-01-13 DIAGNOSIS — Z6828 Body mass index (BMI) 28.0-28.9, adult: Secondary | ICD-10-CM | POA: Diagnosis not present

## 2020-01-13 DIAGNOSIS — E1169 Type 2 diabetes mellitus with other specified complication: Secondary | ICD-10-CM | POA: Diagnosis not present

## 2020-01-13 DIAGNOSIS — Z8673 Personal history of transient ischemic attack (TIA), and cerebral infarction without residual deficits: Secondary | ICD-10-CM | POA: Diagnosis not present

## 2020-01-13 NOTE — Telephone Encounter (Signed)
Note faxed and pt aware

## 2020-01-13 NOTE — Telephone Encounter (Signed)
See previous note

## 2020-02-08 ENCOUNTER — Other Ambulatory Visit: Payer: Self-pay | Admitting: Orthopedic Surgery

## 2020-02-09 NOTE — Progress Notes (Signed)
DUE TO COVID-19 ONLY ONE VISITOR IS ALLOWED TO COME WITH YOU AND STAY IN THE WAITING ROOM ONLY DURING PRE OP AND PROCEDURE DAY OF SURGERY. THE 1 VISITOR  MAY VISIT WITH YOU AFTER SURGERY IN YOUR PRIVATE ROOM DURING VISITING HOURS ONLY!  YOU NEED TO HAVE A COVID 19 TEST ON_______ @_______ , THIS TEST MUST BE DONE BEFORE SURGERY,  COVID TESTING SITE [address] [place] [address], IT IS ON THE RIGHT GOING OUT [address] APPROXIMATELY  2 MINUTES PAST [employer] ON THE RIGHT. ONCE YOUR COVID TEST IS COMPLETED,  PLEASE BEGIN THE QUARANTINE INSTRUCTIONS AS OUTLINED IN YOUR HANDOUT.                Kimberly Lester  02/09/2020   Your procedure is scheduled on:    Report to [place]  Entrance   Report to admitting at AM     Call this number if you have problems the morning of surgery [phone]    REMEMBER: NO  SOLID FOOD CANDY OR GUM AFTER MIDNIGHT. CLEAR LIQUIDS UNTIL         . NOTHING BY MOUTH EXCEPT CLEAR LIQUIDS UNTIL    . PLEASE FINISH ENSURE DRINK PER SURGEON ORDER  WHICH NEEDS TO BE COMPLETED AT      .      CLEAR LIQUID DIET   Foods Allowed                                                                    Coffee and tea, regular and decaf                            Fruit ices (not with fruit pulp)                                      Iced Popsicles                                    Carbonated beverages, regular and diet                                    Cranberry, grape and apple juices Sports drinks like Gatorade Lightly seasoned clear broth or consume(fat free) Sugar, honey syrup ___________________________________________________________________      BRUSH YOUR TEETH MORNING OF SURGERY AND RINSE YOUR MOUTH OUT, NO CHEWING GUM CANDY OR MINTS.     Take these medicines the morning of surgery with A SIP OF WATER:   DO NOT TAKE ANY DIABETIC MEDICATIONS DAY OF YOUR SURGERY                               You may not have any metal on  your body including hair pins and              piercings  Do not wear jewelry, make-up, lotions, powders or perfumes, deodorant  Do not wear nail polish on your fingernails.  Do not shave  48 hours prior to surgery.              Men may shave face and neck.   Do not bring valuables to the hospital. [facility].  Contacts, dentures or bridgework may not be worn into surgery.  Leave suitcase in the car. After surgery it may be brought to your room.     Patients discharged the day of surgery will not be allowed to drive home. IF YOU ARE HAVING SURGERY AND GOING HOME THE SAME DAY, YOU MUST HAVE AN ADULT TO DRIVE YOU HOME AND BE WITH YOU FOR 24 HOURS. YOU MAY GO HOME BY TAXI OR UBER OR ORTHERWISE, BUT AN ADULT MUST ACCOMPANY YOU HOME AND STAY WITH YOU FOR 24 HOURS.  Name and phone number of your driver:  Special Instructions: N/A              Please read over the following fact sheets you were given: _____________________________________________________________________  [facility] - Preparing for Surgery Before surgery, you can play an important role.  Because skin is not sterile, your skin needs to be as free of germs as possible.  You can reduce the number of germs on your skin by washing with CHG (chlorahexidine gluconate) soap before surgery.  CHG is an antiseptic cleaner which kills germs and bonds with the skin to continue killing germs even after washing. Please DO NOT use if you have an allergy to CHG or antibacterial soaps.  If your skin becomes reddened/irritated stop using the CHG and inform your nurse when you arrive at Short Stay. Do not shave (including legs and underarms) for at least 48 hours prior to the first CHG shower.  You may shave your face/neck. Please follow these instructions carefully:  1.  Shower with CHG Soap the night before surgery and the  morning of Surgery.  2.  If you choose to wash your hair, wash your  hair first as usual with your  normal  shampoo.  3.  After you shampoo, rinse your hair and body thoroughly to remove the  shampoo.                           4.  Use CHG as you would any other liquid soap.  You can apply chg directly  to the skin and wash                       Gently with a scrungie or clean washcloth.  5.  Apply the CHG Soap to your body ONLY FROM THE NECK DOWN.   Do not use on face/ open                           Wound or open sores. Avoid contact with eyes, ears mouth and genitals (private parts).                       Wash face,  Genitals (private parts) with your normal soap.             6.  Wash thoroughly, paying special attention to the area where your surgery  will be performed.  7.  Thoroughly rinse your body with  warm water from the neck down.  8.  DO NOT shower/wash with your normal soap after using and rinsing off  the CHG Soap.                9.  Pat yourself dry with a clean towel.            10.  Wear clean pajamas.            11.  Place clean sheets on your bed the night of your first shower and do not  sleep with pets. Day of Surgery : Do not apply any lotions/deodorants the morning of surgery.  Please wear clean clothes to the hospital/surgery center.  FAILURE TO FOLLOW THESE INSTRUCTIONS MAY RESULT IN THE CANCELLATION OF YOUR SURGERY PATIENT SIGNATURE_________________________________  NURSE SIGNATURE__________________________________  ________________________________________________________________________             DUE TO COVID-19 ONLY ONE VISITOR IS ALLOWED TO COME WITH YOU AND STAY IN THE WAITING ROOM ONLY DURING PRE OP AND PROCEDURE DAY OF SURGERY. THE 1 VISITOR  MAY VISIT WITH YOU AFTER SURGERY IN YOUR PRIVATE ROOM DURING VISITING HOURS ONLY!  YOU NEED TO HAVE A COVID 19 TEST ON_ 10/1/21______ @_______ , THIS TEST MUST BE DONE BEFORE SURGERY,  COVID TESTING SITE [address] [place] [address], IT IS ON THE RIGHT GOING OUT [address] APPROXIMATELY  2 MINUTES PAST [employer] ON THE RIGHT. ONCE YOUR COVID TEST IS COMPLETED,  PLEASE BEGIN THE QUARANTINE INSTRUCTIONS AS OUTLINED IN YOUR HANDOUT.                Kimberly Lester  02/09/2020   Your procedure is scheduled on: 02/21/2020    Report to [facility] Main  Entrance   Report to admitting at     0700AM     Call this number if you have problems the morning of surgery [phone]    REMEMBER: NO  SOLID FOOD CANDY OR GUM AFTER MIDNIGHT. CLEAR LIQUIDS 0615am. NOTHING BY MOUTH EXCEPT CLEAR LIQUIDS UNTIL    . PLEASE FINISH ENSURE DRINK PER SURGEON ORDER  WHICH NEEDS TO BE COMPLETED AT  0615am    .      CLEAR LIQUID DIET   Foods Allowed                                                                    Coffee and tea, regular and decaf                            Fruit ices (not with fruit pulp)                                      Iced Popsicles                                    Carbonated beverages, regular and diet  Cranberry, grape and apple juices Sports drinks like Gatorade Lightly seasoned clear broth or consume(fat free) Sugar, honey syrup ___________________________________________________________________      BRUSH YOUR TEETH MORNING OF SURGERY AND RINSE YOUR MOUTH OUT, NO CHEWING GUM CANDY OR MINTS.     Take these medicines the morning of surgery with A SIP OF WATER:  Amlodipine, cymbalta, torpol DO NOT TAKE ANY DIABETIC MEDICATIONS DAY OF YOUR SURGERY                               You may not have any metal on your body including hair pins and              piercings  Do not wear jewelry, make-up, lotions, powders or perfumes, deodorant             Do not wear nail polish on your fingernails.  Do not shave  48 hours prior to surgery.              Men may shave face and neck.   Do not bring valuables to the hospital. [place].  Contacts,  dentures or bridgework may not be worn into surgery.  Leave suitcase in the car. After surgery it may be brought to your room.     Patients discharged the day of surgery will not be allowed to drive home. IF YOU ARE HAVING SURGERY AND GOING HOME THE SAME DAY, YOU MUST HAVE AN ADULT TO DRIVE YOU HOME AND BE WITH YOU FOR 24 HOURS. YOU MAY GO HOME BY TAXI OR UBER OR ORTHERWISE, BUT AN ADULT MUST ACCOMPANY YOU HOME AND STAY WITH YOU FOR 24 HOURS.  Name and phone number of your driver:  Special Instructions: N/A              Please read over the following fact sheets you were given: _____________________________________________________________________  [facility] - Preparing for Surgery Before surgery, you can play an important role.  Because skin is not sterile, your skin needs to be as free of germs as possible.  You can reduce the number of germs on your skin by washing with CHG (chlorahexidine gluconate) soap before surgery.  CHG is an antiseptic cleaner which kills germs and bonds with the skin to continue killing germs even after washing. Please DO NOT use if you have an allergy to CHG or antibacterial soaps.  If your skin becomes reddened/irritated stop using the CHG and inform your nurse when you arrive at Short Stay. Do not shave (including legs and underarms) for at least 48 hours prior to the first CHG shower.  You may shave your face/neck. Please follow these instructions carefully:  1.  Shower with CHG Soap the night before surgery and the  morning of Surgery.  2.  If you choose to wash your hair, wash your hair first as usual with your  normal  shampoo.  3.  After you shampoo, rinse your hair and body thoroughly to remove the  shampoo.                           4.  Use CHG as you would any other liquid soap.  You can apply chg directly  to the skin and wash  Gently with a scrungie or clean washcloth.  5.  Apply the CHG Soap to your body ONLY FROM THE NECK DOWN.   Do  not use on face/ open                           Wound or open sores. Avoid contact with eyes, ears mouth and genitals (private parts).                       Wash face,  Genitals (private parts) with your normal soap.             6.  Wash thoroughly, paying special attention to the area where your surgery  will be performed.  7.  Thoroughly rinse your body with warm water from the neck down.  8.  DO NOT shower/wash with your normal soap after using and rinsing off  the CHG Soap.                9.  Pat yourself dry with a clean towel.            10.  Wear clean pajamas.            11.  Place clean sheets on your bed the night of your first shower and do not  sleep with pets. Day of Surgery : Do not apply any lotions/deodorants the morning of surgery.  Please wear clean clothes to the hospital/surgery center.  FAILURE TO FOLLOW THESE INSTRUCTIONS MAY RESULT IN THE CANCELLATION OF YOUR SURGERY PATIENT SIGNATURE_________________________________  NURSE SIGNATURE__________________________________  ________________________________________________________________________

## 2020-02-10 ENCOUNTER — Other Ambulatory Visit: Payer: Self-pay

## 2020-02-10 ENCOUNTER — Encounter (HOSPITAL_COMMUNITY)
Admission: RE | Admit: 2020-02-10 | Discharge: 2020-02-10 | Disposition: A | Discharge status: Home / Self Care | Payer: PPO | Source: Ambulatory Visit | Attending: Orthopedic Surgery | Admitting: Orthopedic Surgery

## 2020-02-10 ENCOUNTER — Encounter (HOSPITAL_COMMUNITY): Payer: Self-pay

## 2020-02-10 DIAGNOSIS — Z01812 Encounter for preprocedural laboratory examination: Secondary | ICD-10-CM | POA: Insufficient documentation

## 2020-02-10 LAB — COMPREHENSIVE METABOLIC PANEL
ALT: 13 U/L (ref 0–44)
AST: 17 U/L (ref 15–41)
Albumin: 3.9 g/dL (ref 3.5–5.0)
Alkaline Phosphatase: 88 U/L (ref 38–126)
Anion gap: 11 (ref 5–15)
BUN: 30 mg/dL — ABNORMAL HIGH (ref 8–23)
CO2: 28 mmol/L (ref 22–32)
Calcium: 8.8 mg/dL — ABNORMAL LOW (ref 8.9–10.3)
Chloride: 99 mmol/L (ref 98–111)
Creatinine, Ser: 1.02 mg/dL — ABNORMAL HIGH (ref 0.44–1.00)
GFR calc Af Amer: 57 mL/min — ABNORMAL LOW (ref >60)
GFR calc non Af Amer: 49 mL/min — ABNORMAL LOW (ref >60)
Glucose, Bld: 132 mg/dL — ABNORMAL HIGH (ref 70–99)
Potassium: 2.9 mmol/L — ABNORMAL LOW (ref 3.5–5.1)
Sodium: 138 mmol/L (ref 135–145)
Total Bilirubin: 0.5 mg/dL (ref 0.3–1.2)
Total Protein: 6.9 g/dL (ref 6.5–8.1)

## 2020-02-10 LAB — CBC WITH DIFFERENTIAL/PLATELET
Abs Immature Granulocytes: 0.02 10*3/uL (ref 0.00–0.07)
Basophils Absolute: 0 10*3/uL (ref 0.0–0.1)
Basophils Relative: 0 %
Eosinophils Absolute: 0.2 10*3/uL (ref 0.0–0.5)
Eosinophils Relative: 3 %
HCT: 37.1 % (ref 36.0–46.0)
Hemoglobin: 11.9 g/dL — ABNORMAL LOW (ref 12.0–15.0)
Immature Granulocytes: 0 %
Lymphocytes Relative: 22 %
Lymphs Abs: 1.1 10*3/uL (ref 0.7–4.0)
MCH: 29.5 pg (ref 26.0–34.0)
MCHC: 32.1 g/dL (ref 30.0–36.0)
MCV: 92.1 fL (ref 80.0–100.0)
Monocytes Absolute: 0.4 10*3/uL (ref 0.1–1.0)
Monocytes Relative: 9 %
Neutro Abs: 3.1 10*3/uL (ref 1.7–7.7)
Neutrophils Relative %: 66 %
Platelets: 220 10*3/uL (ref 150–400)
RBC: 4.03 MIL/uL (ref 3.87–5.11)
RDW: 13.6 % (ref 11.5–15.5)
WBC: 4.8 10*3/uL (ref 4.0–10.5)
nRBC: 0 % (ref 0.0–0.2)

## 2020-02-10 LAB — SURGICAL PCR SCREEN
MRSA, PCR: NEGATIVE
Staphylococcus aureus: NEGATIVE

## 2020-02-11 NOTE — Progress Notes (Signed)
Anesthesia Review:  PCP: Cardiologist :- Cardiac clearance- 12/28/19 [facility]  Chest x-ray : EKG :10/12/19 Echo : 10/29/19  Stress test:10/21/19 Cardiac Cath :  Activity level: can do a flight of stairs without difficulty  Sleep Study/ CPAP : Fasting Blood Sugar :      / Checks Blood Sugar -- times a day:   Blood Thinner/ Instructions /Last Dose: ASA / Instructions/ Last Dose :  ASA and Plavix- at preop appt pt stated she did not have preop instructions.  Pt instructed to call office of DR Lucey for instructions .  Pt voiced understanding.  CMP- potassium-2.9 on 02/10/20 routed to Dr Ronnie Derby on 02/11/20.

## 2020-02-14 NOTE — Progress Notes (Signed)
Anesthesia Chart Review   Case: [id number] Date/Time: 02/21/20 0912   Procedure: UNICOMPARTMENTAL KNEE (Left Knee)   Anesthesia type: Spinal   Pre-op diagnosis: Osteoarthritis of left knee M17.12   Location: [facility] ROOM 08 / [facility]   Surgeons: Vickey Huger, MD      DISCUSSION:86 y.o. never smoker with h/o HTN, GERD, CVA (on Plavix), bilateral carotid bruits, left knee OA scheduled for above procedure 02/21/2020 with Dr. Vickey Huger.   Bilateral carotid bruit on exam at last cardio visit.  [place] ordered.  Per Dr. Geraldo Pitter on [place] results, "Moderate left carotid stenosis. No intervention at this time. Continue statin therapy. Will discuss more at appointment. We will do liver lipid check if not done by primary in the past to 3 months. The results of the study is unremarkable."  Per cardiologist preoperative risk assessment 12/28/2019, "Chart reviewed and patient contacted today by phone as part of pre-operative protocol coverage. Given past medical history and time since last visit, based on ACC/AHA guidelines, Semiah Konczal Gerlich would be at acceptable risk for the planned procedure without further cardiovascular testing."  At PAT visit pt reported she had no instructions on when to hold Plavix.  Prescribed by PCP.  I informed Dr. Ruel Favors scheduler and reached out to PCP.    Potassium 2.9 at PAT visit.  PCP and Dr. Ronnie Derby informed.  Will recheck DOS.   Anticipate pt can proceed with planned procedure barring acute status change.   VS: BP (!) 116/56   Pulse 63   Temp 36.9 C (Oral)   Resp 18   Ht 5\' 2"  (1.575 m)   Wt 65.9 kg   SpO2 95%   BMI 26.59 kg/m   PROVIDERS: Greig Right, MD is PCP   Jyl Heinz, MD is Cardiologist  LABS: Labs reviewed: Acceptable for surgery. (all labs ordered are listed, but only abnormal results are displayed)  Labs Reviewed  CBC WITH DIFFERENTIAL/PLATELET - Abnormal; Notable for the following components:      Result Value   Hemoglobin 11.9 (*)    All other  components within normal limits  COMPREHENSIVE METABOLIC PANEL - Abnormal; Notable for the following components:   Potassium 2.9 (*)    Glucose, Bld 132 (*)    BUN 30 (*)    Creatinine, Ser 1.02 (*)    Calcium 8.8 (*)    GFR calc non Af Amer 49 (*)    GFR calc Af Amer 57 (*)    All other components within normal limits  SURGICAL PCR SCREEN     IMAGES:   EKG: 10/12/2019 Rate 67 bpm  Sinus rhythm with 1st degree AV block  Left axis deviation  Voltage criteria for left ventricular hypertrophy  Inferior infarct, age undetermined Anterior infarct, age undetermined   CV: Echo 10/29/2019 IMPRESSIONS    1. Left ventricular ejection fraction, by estimation, is 55 to 60%. The  left ventricle has normal function. The left ventricle has no regional  wall motion abnormalities. Left ventricular diastolic parameters are  consistent with Grade II diastolic  dysfunction (pseudonormalization).  2. Right ventricular systolic function is normal. The right ventricular  size is normal. There is normal pulmonary artery systolic pressure.  3. Left atrial size was severely dilated.  4. The mitral valve is normal in structure. Trivial mitral valve  regurgitation. No evidence of mitral stenosis.  5. The aortic valve is normal in structure. Aortic valve regurgitation is  mild. No aortic stenosis is present.  6. The inferior vena cava  is normal in size with greater than 50%  respiratory variability, suggesting right atrial pressure of 3 mmHg.   Comparison(s): A prior study was performed on 06/10/2017. No significant  change from prior study.  Myocardial Perfusion 10/20/2019  Nuclear stress EF: 71%.  The left ventricular ejection fraction is hyperdynamic (>65%).  There was no ST segment deviation noted during stress.  Defect 1: There is a small defect of mild severity present in the apex location.  This is a low risk study.  No ischemia or scar noted.  Normal EF.  Past Medical  History:  Diagnosis Date  . GERD (gastroesophageal reflux disease)   . Hypercholesterolemia   . Hypertension   . Osteoarthritis   . Restless leg syndrome   . Stroke ([place]) 06/16/2011   some balance issues, finger numbness  . TIA (transient ischemic attack) 07/20/2011    Past Surgical History:  Procedure Laterality Date  . JOINT REPLACEMENT    . TOTAL HIP ARTHROPLASTY Right 07/12/2010   at [place]  . TOTAL HIP ARTHROPLASTY Left 06/25/2017  . TOTAL HIP ARTHROPLASTY Left 06/25/2017   Procedure: TOTAL HIP ARTHROPLASTY ANTERIOR APPROACH;  Surgeon: Frederik Pear, MD;  Location: [facility];  Service: Orthopedics;  Laterality: Left;    MEDICATIONS: . acetaminophen (TYLENOL) 500 MG tablet  . amLODipine (NORVASC) 5 MG tablet  . aspirin 325 MG EC tablet  . clopidogrel (PLAVIX) 75 MG tablet  . DULoxetine (CYMBALTA) 30 MG capsule  . gabapentin (NEURONTIN) 100 MG capsule  . hydrochlorothiazide (HYDRODIURIL) 25 MG tablet  . lansoprazole (PREVACID) 30 MG capsule  . meclizine (ANTIVERT) 25 MG tablet  . methyl salicylate-menthol (BENGAY) ointment  . metoprolol succinate (TOPROL-XL) 50 MG 24 hr tablet  . psyllium (METAMUCIL SMOOTH TEXTURE) 28 % packet  . rOPINIRole (REQUIP) 1 MG tablet  . rosuvastatin (CRESTOR) 40 MG tablet  . traMADol (ULTRAM) 50 MG tablet   No current facility-administered medications for this encounter.   Konrad Felix, PA-C [facility] Pre-Surgical Testing [phone]

## 2020-02-14 NOTE — Anesthesia Preprocedure Evaluation (Addendum)
Anesthesia Evaluation  Patient identified by MRN, date of birth, ID band Patient awake    Reviewed: Allergy & Precautions, NPO status , Patient's Chart, lab work & pertinent test results  Airway Mallampati: II  TM Distance: >3 FB Neck ROM: Full    Dental no notable dental hx.    Pulmonary neg pulmonary ROS,    Pulmonary exam normal breath sounds clear to auscultation       Cardiovascular hypertension, Pt. on medications and Pt. on home beta blockers Normal cardiovascular exam Rhythm:Regular Rate:Normal     Neuro/Psych TIACVA, Residual Symptoms negative psych ROS   GI/Hepatic Neg liver ROS, GERD  ,  Endo/Other  negative endocrine ROS  Renal/GU negative Renal ROS  negative genitourinary   Musculoskeletal  (+) Arthritis , Osteoarthritis,    Abdominal   Peds negative pediatric ROS (+)  Hematology negative hematology ROS (+)   Anesthesia Other Findings   Reproductive/Obstetrics negative OB ROS                            Anesthesia Physical Anesthesia Plan  ASA: III  Anesthesia Plan: Spinal   Post-op Pain Management:  Regional for Post-op pain   Induction: Intravenous  PONV Risk Score and Plan: 2 and Ondansetron and Dexamethasone  Airway Management Planned: Simple Face Mask  Additional Equipment:   Intra-op Plan:   Post-operative Plan:   Informed Consent: I have reviewed the patients History and Physical, chart, labs and discussed the procedure including the risks, benefits and alternatives for the proposed anesthesia with the patient or authorized representative who has indicated his/her understanding and acceptance.     Dental advisory given  Plan Discussed with: CRNA and Surgeon  Anesthesia Plan Comments: (See PAT note 02/10/2020, Konrad Felix, PA-C)       Anesthesia Quick Evaluation

## 2020-02-18 ENCOUNTER — Other Ambulatory Visit (HOSPITAL_COMMUNITY)
Admission: RE | Admit: 2020-02-18 | Discharge: 2020-02-18 | Disposition: A | Discharge status: Home / Self Care | Payer: PPO | Source: Ambulatory Visit | Attending: Orthopedic Surgery | Admitting: Orthopedic Surgery

## 2020-02-18 DIAGNOSIS — Z01812 Encounter for preprocedural laboratory examination: Secondary | ICD-10-CM | POA: Insufficient documentation

## 2020-02-18 DIAGNOSIS — Z20822 Contact with and (suspected) exposure to covid-19: Secondary | ICD-10-CM | POA: Diagnosis not present

## 2020-02-18 LAB — SARS CORONAVIRUS 2 (TAT 6-24 HRS): SARS Coronavirus 2: NEGATIVE

## 2020-02-20 MED ORDER — BUPIVACAINE LIPOSOME 1.3 % IJ SUSP
20.0000 mL | Freq: Once | INTRAMUSCULAR | Status: DC
  Filled 2020-02-20: qty 20

## 2020-02-21 ENCOUNTER — Ambulatory Visit (HOSPITAL_COMMUNITY)
Admission: RE | Admit: 2020-02-21 | Discharge: 2020-02-21 | Disposition: A | Discharge status: Home / Self Care | Payer: PPO | Attending: Orthopedic Surgery | Admitting: Orthopedic Surgery

## 2020-02-21 ENCOUNTER — Encounter (HOSPITAL_COMMUNITY): Payer: Self-pay | Admitting: Orthopedic Surgery

## 2020-02-21 ENCOUNTER — Ambulatory Visit (HOSPITAL_COMMUNITY): Payer: PPO | Admitting: Physician Assistant

## 2020-02-21 ENCOUNTER — Encounter (HOSPITAL_COMMUNITY)
Admission: RE | Disposition: A | Discharge status: Home / Self Care | Payer: Self-pay | Source: Home / Self Care | Attending: Orthopedic Surgery

## 2020-02-21 DIAGNOSIS — I10 Essential (primary) hypertension: Secondary | ICD-10-CM | POA: Insufficient documentation

## 2020-02-21 DIAGNOSIS — Z96643 Presence of artificial hip joint, bilateral: Secondary | ICD-10-CM | POA: Insufficient documentation

## 2020-02-21 DIAGNOSIS — Z7982 Long term (current) use of aspirin: Secondary | ICD-10-CM | POA: Diagnosis not present

## 2020-02-21 DIAGNOSIS — Z825 Family history of asthma and other chronic lower respiratory diseases: Secondary | ICD-10-CM | POA: Diagnosis not present

## 2020-02-21 DIAGNOSIS — E7800 Pure hypercholesterolemia, unspecified: Secondary | ICD-10-CM | POA: Diagnosis not present

## 2020-02-21 DIAGNOSIS — Z7902 Long term (current) use of antithrombotics/antiplatelets: Secondary | ICD-10-CM | POA: Diagnosis not present

## 2020-02-21 DIAGNOSIS — Z8249 Family history of ischemic heart disease and other diseases of the circulatory system: Secondary | ICD-10-CM | POA: Diagnosis not present

## 2020-02-21 DIAGNOSIS — Z885 Allergy status to narcotic agent status: Secondary | ICD-10-CM | POA: Insufficient documentation

## 2020-02-21 DIAGNOSIS — G8918 Other acute postprocedural pain: Secondary | ICD-10-CM | POA: Diagnosis not present

## 2020-02-21 DIAGNOSIS — E785 Hyperlipidemia, unspecified: Secondary | ICD-10-CM | POA: Diagnosis not present

## 2020-02-21 DIAGNOSIS — Z836 Family history of other diseases of the respiratory system: Secondary | ICD-10-CM | POA: Insufficient documentation

## 2020-02-21 DIAGNOSIS — Z79899 Other long term (current) drug therapy: Secondary | ICD-10-CM | POA: Diagnosis not present

## 2020-02-21 DIAGNOSIS — M1712 Unilateral primary osteoarthritis, left knee: Secondary | ICD-10-CM | POA: Diagnosis not present

## 2020-02-21 DIAGNOSIS — I69393 Ataxia following cerebral infarction: Secondary | ICD-10-CM | POA: Insufficient documentation

## 2020-02-21 DIAGNOSIS — Z888 Allergy status to other drugs, medicaments and biological substances status: Secondary | ICD-10-CM | POA: Insufficient documentation

## 2020-02-21 DIAGNOSIS — G2581 Restless legs syndrome: Secondary | ICD-10-CM | POA: Diagnosis not present

## 2020-02-21 DIAGNOSIS — K219 Gastro-esophageal reflux disease without esophagitis: Secondary | ICD-10-CM | POA: Insufficient documentation

## 2020-02-21 HISTORY — PX: PARTIAL KNEE ARTHROPLASTY: SHX2174

## 2020-02-21 LAB — BASIC METABOLIC PANEL
Anion gap: 9 (ref 5–15)
BUN: 26 mg/dL — ABNORMAL HIGH (ref 8–23)
CO2: 25 mmol/L (ref 22–32)
Calcium: 8.9 mg/dL (ref 8.9–10.3)
Chloride: 104 mmol/L (ref 98–111)
Creatinine, Ser: 0.89 mg/dL (ref 0.44–1.00)
GFR calc Af Amer: >60 mL/min (ref >60)
GFR calc non Af Amer: 58 mL/min — ABNORMAL LOW (ref >60)
Glucose, Bld: 263 mg/dL — ABNORMAL HIGH (ref 70–99)
Potassium: 3.3 mmol/L — ABNORMAL LOW (ref 3.5–5.1)
Sodium: 138 mmol/L (ref 135–145)

## 2020-02-21 LAB — GLUCOSE, CAPILLARY
Glucose-Capillary: 62 mg/dL — ABNORMAL LOW (ref 70–99)
Glucose-Capillary: 105 mg/dL — ABNORMAL HIGH (ref 70–99)

## 2020-02-21 SURGERY — ARTHROPLASTY, KNEE, UNICOMPARTMENTAL
Anesthesia: Spinal | Site: Knee | Laterality: Left

## 2020-02-21 MED ORDER — PROPOFOL 500 MG/50ML IV EMUL
INTRAVENOUS | Status: DC | PRN
  Administered 2020-02-21: 35 ug/kg/min via INTRAVENOUS

## 2020-02-21 MED ORDER — SODIUM CHLORIDE 0.9% FLUSH
INTRAVENOUS | Status: DC | PRN
  Administered 2020-02-21: 20 mL

## 2020-02-21 MED ORDER — CEFAZOLIN SODIUM-DEXTROSE 2-4 GM/100ML-% IV SOLN
2.0000 g | INTRAVENOUS | Status: AC
  Administered 2020-02-21: 2 g via INTRAVENOUS
  Filled 2020-02-21: qty 100

## 2020-02-21 MED ORDER — TRANEXAMIC ACID-NACL 1000-0.7 MG/100ML-% IV SOLN
1000.0000 mg | INTRAVENOUS | Status: AC
  Administered 2020-02-21: 1000 mg via INTRAVENOUS
  Filled 2020-02-21: qty 100

## 2020-02-21 MED ORDER — ONDANSETRON HCL 4 MG/2ML IJ SOLN
INTRAMUSCULAR | Status: AC
  Filled 2020-02-21: qty 2

## 2020-02-21 MED ORDER — BUPIVACAINE-EPINEPHRINE 0.25% -1:200000 IJ SOLN
INTRAMUSCULAR | Status: DC | PRN
  Administered 2020-02-21: 15 mL

## 2020-02-21 MED ORDER — MEPIVACAINE HCL (PF) 2 % IJ SOLN
INTRAMUSCULAR | Status: DC | PRN
  Administered 2020-02-21: 3 mL via INTRATHECAL

## 2020-02-21 MED ORDER — HYDROCODONE-ACETAMINOPHEN 5-325 MG PO TABS: 1.0000 | ORAL_TABLET | Freq: Four times a day (QID) | ORAL | 0 refills | Status: DC | PRN

## 2020-02-21 MED ORDER — TIZANIDINE HCL 2 MG PO TABS: 2.0000 mg | ORAL_TABLET | Freq: Four times a day (QID) | ORAL | 0 refills | Status: DC | PRN

## 2020-02-21 MED ORDER — PHENYLEPHRINE HCL-NACL 10-0.9 MG/250ML-% IV SOLN
INTRAVENOUS | Status: DC | PRN
  Administered 2020-02-21: 50 ug/min via INTRAVENOUS

## 2020-02-21 MED ORDER — STERILE WATER FOR IRRIGATION IR SOLN
Status: DC | PRN
  Administered 2020-02-21: 2000 mL

## 2020-02-21 MED ORDER — MEPIVACAINE HCL (PF) 2 % IJ SOLN
INTRAMUSCULAR | Status: AC
  Filled 2020-02-21: qty 20

## 2020-02-21 MED ORDER — BUPIVACAINE HCL (PF) 0.5 % IJ SOLN
INTRAMUSCULAR | Status: DC | PRN
  Administered 2020-02-21: 20 mL via PERINEURAL

## 2020-02-21 MED ORDER — 0.9 % SODIUM CHLORIDE (POUR BTL) OPTIME
TOPICAL | Status: DC | PRN
  Administered 2020-02-21: 1000 mL

## 2020-02-21 MED ORDER — HYDROMORPHONE HCL 1 MG/ML IJ SOLN: 0.2500 mg | INTRAMUSCULAR | Status: DC | PRN

## 2020-02-21 MED ORDER — SODIUM CHLORIDE 0.9 % IR SOLN
Status: DC | PRN
  Administered 2020-02-21: 1000 mL

## 2020-02-21 MED ORDER — LACTATED RINGERS IV BOLUS
250.0000 mL | Freq: Once | INTRAVENOUS | Status: AC
  Administered 2020-02-21: 250 mL via INTRAVENOUS

## 2020-02-21 MED ORDER — POVIDONE-IODINE 10 % EX SWAB
2.0000 "application " | Freq: Once | CUTANEOUS | Status: AC
  Administered 2020-02-21: 2 via TOPICAL

## 2020-02-21 MED ORDER — FENTANYL CITRATE (PF) 100 MCG/2ML IJ SOLN
50.0000 ug | Freq: Once | INTRAMUSCULAR | Status: AC
  Administered 2020-02-21: 50 ug via INTRAVENOUS
  Filled 2020-02-21: qty 2

## 2020-02-21 MED ORDER — BUPIVACAINE-EPINEPHRINE (PF) 0.25% -1:200000 IJ SOLN
INTRAMUSCULAR | Status: AC
  Filled 2020-02-21: qty 30

## 2020-02-21 MED ORDER — DEXAMETHASONE SODIUM PHOSPHATE 10 MG/ML IJ SOLN
INTRAMUSCULAR | Status: AC
  Filled 2020-02-21: qty 1

## 2020-02-21 MED ORDER — CELECOXIB 200 MG PO CAPS
400.0000 mg | ORAL_CAPSULE | Freq: Once | ORAL | Status: AC
  Administered 2020-02-21: 400 mg via ORAL
  Filled 2020-02-21: qty 2

## 2020-02-21 MED ORDER — ACETAMINOPHEN 500 MG PO TABS
1000.0000 mg | ORAL_TABLET | Freq: Once | ORAL | Status: AC
  Administered 2020-02-21: 1000 mg via ORAL
  Filled 2020-02-21: qty 2

## 2020-02-21 MED ORDER — DEXAMETHASONE SODIUM PHOSPHATE 10 MG/ML IJ SOLN: 8.0000 mg | Freq: Once | INTRAMUSCULAR | Status: DC

## 2020-02-21 MED ORDER — SODIUM CHLORIDE (PF) 0.9 % IJ SOLN
INTRAMUSCULAR | Status: AC
  Filled 2020-02-21: qty 20

## 2020-02-21 MED ORDER — ONDANSETRON HCL 4 MG/2ML IJ SOLN
INTRAMUSCULAR | Status: DC | PRN
  Administered 2020-02-21: 4 mg via INTRAVENOUS

## 2020-02-21 MED ORDER — PROPOFOL 10 MG/ML IV BOLUS
INTRAVENOUS | Status: DC | PRN
  Administered 2020-02-21: 20 mg via INTRAVENOUS

## 2020-02-21 MED ORDER — PROPOFOL 500 MG/50ML IV EMUL
INTRAVENOUS | Status: AC
  Filled 2020-02-21: qty 50

## 2020-02-21 MED ORDER — CEFAZOLIN SODIUM-DEXTROSE 2-4 GM/100ML-% IV SOLN: 2.0000 g | Freq: Four times a day (QID) | INTRAVENOUS | Status: DC

## 2020-02-21 MED ORDER — GABAPENTIN 300 MG PO CAPS
300.0000 mg | ORAL_CAPSULE | Freq: Once | ORAL | Status: AC
  Administered 2020-02-21: 300 mg via ORAL
  Filled 2020-02-21: qty 1

## 2020-02-21 MED ORDER — BUPIVACAINE LIPOSOME 1.3 % IJ SUSP
INTRAMUSCULAR | Status: DC | PRN
  Administered 2020-02-21: 20 mL

## 2020-02-21 MED ORDER — LACTATED RINGERS IV SOLN: INTRAVENOUS | Status: DC | PRN

## 2020-02-21 MED ORDER — PHENYLEPHRINE HCL (PRESSORS) 10 MG/ML IV SOLN
INTRAVENOUS | Status: AC
  Filled 2020-02-21: qty 1

## 2020-02-21 MED ORDER — DEXAMETHASONE SODIUM PHOSPHATE 10 MG/ML IJ SOLN
INTRAMUSCULAR | Status: DC | PRN
  Administered 2020-02-21: 8 mg via INTRAVENOUS

## 2020-02-21 SURGICAL SUPPLY — 48 items
BAG ZIPLOCK 12X15 (MISCELLANEOUS) ×3 IMPLANT
BEARING TIBIAL UKA SZ2 9MM (Orthopedic Implant) ×1 IMPLANT
BLADE SAW RECIPROCATING 77.5 (BLADE) ×3 IMPLANT
BLADE SAW SAG 90X13X1.27 (BLADE) ×3 IMPLANT
BNDG ELASTIC 6X5.8 VLCR STR LF (GAUZE/BANDAGES/DRESSINGS) ×3 IMPLANT
BOWL SMART MIX CTS (DISPOSABLE) ×3 IMPLANT
CEMENT BONE SIMPLEX SPEEDSET (Cement) ×3 IMPLANT
CLOSURE WOUND 1/2 X4 (GAUZE/BANDAGES/DRESSINGS) ×2
COMP FEMUR SZ2 IBAL LT MED (Knees) ×3 IMPLANT
COMPONENT FEMUR SZ2 IBALLT MED (Knees) ×1 IMPLANT
COVER SURGICAL LIGHT HANDLE (MISCELLANEOUS) ×3 IMPLANT
COVER WAND RF STERILE (DRAPES) IMPLANT
CUFF TOURN SGL QUICK 34 (TOURNIQUET CUFF) ×3
CUFF TRNQT CYL 34X4.125X (TOURNIQUET CUFF) ×1 IMPLANT
DECANTER SPIKE VIAL GLASS SM (MISCELLANEOUS) IMPLANT
DRAPE INCISE IOBAN 66X45 STRL (DRAPES) ×6 IMPLANT
DRSG AQUACEL AG ADV 3.5X 6 (GAUZE/BANDAGES/DRESSINGS) ×3 IMPLANT
DURAPREP 26ML APPLICATOR (WOUND CARE) ×3 IMPLANT
ELECT REM PT RETURN 15FT ADLT (MISCELLANEOUS) ×3 IMPLANT
GLOVE BIOGEL PI IND STRL 7.5 (GLOVE) ×1 IMPLANT
GLOVE BIOGEL PI IND STRL 8.5 (GLOVE) ×2 IMPLANT
GLOVE BIOGEL PI INDICATOR 7.5 (GLOVE) ×2
GLOVE BIOGEL PI INDICATOR 8.5 (GLOVE) ×4
GLOVE SURG ORTHO 8.0 STRL STRW (GLOVE) ×9 IMPLANT
GOWN STRL REUS W/TWL 2XL LVL3 (GOWN DISPOSABLE) ×3 IMPLANT
GOWN STRL REUS W/TWL XL LVL3 (GOWN DISPOSABLE) ×6 IMPLANT
GUIDE UKA TIBIAL DISPOSEABLE (DISPOSABLE) ×3 IMPLANT
HANDPIECE INTERPULSE COAX TIP (DISPOSABLE) ×3
HOLDER FOLEY CATH W/STRAP (MISCELLANEOUS) IMPLANT
HOOD PEEL AWAY FLYTE STAYCOOL (MISCELLANEOUS) ×9 IMPLANT
KIT TURNOVER KIT A (KITS) IMPLANT
MANIFOLD NEPTUNE II (INSTRUMENTS) ×3 IMPLANT
NS IRRIG 1000ML POUR BTL (IV SOLUTION) ×3 IMPLANT
PACK TOTAL KNEE CUSTOM (KITS) ×3 IMPLANT
PENCIL SMOKE EVACUATOR (MISCELLANEOUS) IMPLANT
SET HNDPC FAN SPRY TIP SCT (DISPOSABLE) ×1 IMPLANT
STRIP CLOSURE SKIN 1/2X4 (GAUZE/BANDAGES/DRESSINGS) ×4 IMPLANT
SUT MNCRL AB 4-0 PS2 18 (SUTURE) ×3 IMPLANT
SUT STRATAFIX 0 PDS 27 VIOLET (SUTURE) ×3
SUT STRATAFIX PDS+ 0 24IN (SUTURE) ×3 IMPLANT
SUT VIC AB 1 CT1 36 (SUTURE) ×3 IMPLANT
SUTURE STRATFX 0 PDS 27 VIOLET (SUTURE) ×1 IMPLANT
TIBIAL BEARING UKA SZ2 9MM (Orthopedic Implant) ×3 IMPLANT
TRAY FOLEY MTR SLVR 16FR STAT (SET/KITS/TRAYS/PACK) ×3 IMPLANT
TRAY TIB CMNT UKA SZ 2 KNEE (Miscellaneous) ×3 IMPLANT
WATER STERILE IRR 1000ML POUR (IV SOLUTION) ×3 IMPLANT
WRAP KNEE MAXI GEL POST OP (GAUZE/BANDAGES/DRESSINGS) ×3 IMPLANT
YANKAUER SUCT BULB TIP 10FT TU (MISCELLANEOUS) ×3 IMPLANT

## 2020-02-21 NOTE — Transfer of Care (Signed)
Immediate Anesthesia Transfer of Care Note  Patient: Kimberly Lester  Procedure(s) Performed: UNICOMPARTMENTAL KNEE (Left Knee)  Patient Location: PACU  Anesthesia Type:Spinal  Level of Consciousness: awake  Airway & Oxygen Therapy: Patient Spontanous Breathing and Patient connected to face mask oxygen  Post-op Assessment: Report given to RN and Post -op Vital signs reviewed and stable  Post vital signs: Reviewed and stable  Last Vitals:  Vitals Value Taken Time  BP 94/53 02/21/20 1050  Temp    Pulse 56 02/21/20 1052  Resp 14 02/21/20 1052  SpO2 99 % 02/21/20 1052  Vitals shown include unvalidated device data.  Last Pain:  Vitals:   02/21/20 0913  TempSrc:   PainSc: 0-No pain         Complications: No complications documented.

## 2020-02-21 NOTE — Anesthesia Procedure Notes (Signed)
Spinal  Patient location during procedure: OR Start time: 02/21/2020 9:36 AM End time: 02/21/2020 9:42 AM Staffing Performed: resident/CRNA  Resident/CRNA: Sharlette Dense, CRNA Preanesthetic Checklist Completed: patient identified, IV checked, site marked, risks and benefits discussed, surgical consent, monitors and equipment checked, pre-op evaluation and timeout performed Spinal Block Patient position: sitting Prep: DuraPrep and site prepped and draped Patient monitoring: heart rate, blood pressure and continuous pulse ox Location: L3-4 Injection technique: single-shot Needle Needle type: Sprotte  Needle gauge: 24 G Needle length: 9 cm Additional Notes Kit expiration date 09/16/2020 and lot # [id number] Clear free flow of CSF, negative heme, negative paresthesia Tolerated well and returned to supine position

## 2020-02-21 NOTE — Anesthesia Postprocedure Evaluation (Signed)
Anesthesia Post Note  Patient: Kimberly Lester  Procedure(s) Performed: UNICOMPARTMENTAL KNEE (Left Knee)     Patient location during evaluation: PACU Anesthesia Type: Spinal Level of consciousness: oriented and awake and alert Pain management: pain level controlled Vital Signs Assessment: post-procedure vital signs reviewed and stable Respiratory status: spontaneous breathing, respiratory function stable and patient connected to nasal cannula oxygen Cardiovascular status: blood pressure returned to baseline and stable Postop Assessment: no headache, no backache and no apparent nausea or vomiting Anesthetic complications: no   No complications documented.  Last Vitals:  Vitals:   02/21/20 1115 02/21/20 1130  BP: (!) 103/51 (!) 104/58  Pulse: (!) 57 (!) 56  Resp: 14 15  Temp:    SpO2: 96% 95%    Last Pain:  Vitals:   02/21/20 1130  TempSrc:   PainSc: 0-No pain                 Crislyn Willbanks S

## 2020-02-21 NOTE — Progress Notes (Signed)
Assisted Dr. Kalman Shan with left, ultrasound guided, knee, adductor canal block. Side rails up, monitors on throughout procedure. See vital signs in flow sheet. Tolerated Procedure well.

## 2020-02-21 NOTE — Evaluation (Signed)
Physical Therapy Evaluation Patient Details Name: Kimberly Lester MRN: [id number] DOB: [birth year] Today's Date: 02/21/2020   History of Present Illness  Patient is 86 y.o. female s/p Lt UKA on 02/21/20 with PMH since stroke with balance issues, HTN, GERD, OA, Bil THA.  Clinical Impression  Kimberly Lester is a 86 y.o. female POD 0 s/p Lt UKA. Patient reports independence with [facility] for mobility at baseline. Patient is now limited by functional impairments (see PT problem list below) and requires min guard/supervision for transfers and gait with RW. Patient was able to ambulate ~120 feet with RW and min guard/supervision and cues for safe walker management. Patient's daughter present for family training and demonstrated safe guarding for gait and transfers with patient. Patient instructed in exercises to facilitate ROM and circulation. Patient will benefit from continued skilled PT interventions to address impairments and progress towards PLOF. Patient has met mobility goals at adequate level for discharge home; will continue to follow if pt continues acute stay to progress towards Mod I goals.     Follow Up Recommendations Follow surgeon's recommendation for DC plan and follow-up therapies;Outpatient PT    Equipment Recommendations  None recommended by PT    Recommendations for Other Services       Precautions / Restrictions Precautions Precautions: Fall Restrictions Weight Bearing Restrictions: No Other Position/Activity Restrictions: WBAT      Mobility  Bed Mobility Overal bed mobility: Needs Assistance Bed Mobility: Supine to Sit;Sit to Supine     Supine to sit: Min guard;Supervision Sit to supine: Min guard   General bed mobility comments: no assist required to rise and sit EOB, pt taking extra time.   Transfers Overall transfer level: Needs assistance Equipment used: Rolling walker (2 wheeled) Transfers: Sit to/from [facility] Sit to Stand: Min  guard;Supervision Stand pivot transfers: Min guard       General transfer comment: cues for safe technique with RW, no overt LOB noted with rise, pt steady in standing. pt using grab bar by toilet and cues required to extend Lt LE forward when sitting.   Ambulation/Gait Ambulation/Gait assistance: Min guard;Supervision Gait Distance (Feet): 120 Feet Assistive device: Rolling walker (2 wheeled) Gait Pattern/deviations: Step-through pattern;Decreased stride length;Decreased weight shift to left Gait velocity: decr   General Gait Details: VC's for safe step pattern with RW and proximity. no overt LOB or buckling on Lt LE. pts daughter instructed on safe guarding position and demonstrated good technique walking with pt.   Stairs            Wheelchair Mobility    Modified Rankin (Stroke Patients Only)       Balance Overall balance assessment: Needs assistance;History of Falls Sitting-balance support: Feet supported Sitting balance-Leahy Scale: Good     Standing balance support: During functional activity;Bilateral upper extremity supported Standing balance-Leahy Scale: Fair                               Pertinent Vitals/Pain Pain Assessment: No/denies pain    Home Living Family/patient expects to be discharged to:: Private residence Living Arrangements: Spouse/significant other Available Help at Discharge: Family Type of Home: House Home Access: Ramped entrance     Home Layout: Two level;Full bath on main level;Able to live on main level with bedroom/bathroom (only uses main level) Home Equipment: Walker - 2 wheels;Cane - single point;Shower seat;Grab bars - tub/shower;Grab bars - toilet;Bedside commode Additional Comments: pt's daughter will stay with  her for ~3 days.    Prior Function Level of Independence: Independent with assistive device(s)         Comments: pt typically independent with ADl's, gait with SPC, and doe her own driving and  grocery shopping.      Hand Dominance   Dominant Hand: Right    Extremity/Trunk Assessment   Upper Extremity Assessment Upper Extremity Assessment: Generalized weakness    Lower Extremity Assessment Lower Extremity Assessment: Generalized weakness;LLE deficits/detail LLE Deficits / Details: no extensor lag with SLR LLE Sensation: WNL LLE Coordination: WNL    Cervical / Trunk Assessment Cervical / Trunk Assessment: Kyphotic  Communication   Communication: No difficulties  Cognition Arousal/Alertness: Awake/alert Behavior During Therapy: WFL for tasks assessed/performed Overall Cognitive Status: Within Functional Limits for tasks assessed                                        General Comments      Exercises Total Joint Exercises Ankle Circles/Pumps: AROM;Both;Supine;10 reps Quad Sets: AROM;Left;5 reps;Supine Short Arc Quad: AROM;Left;Other reps (comment);Supine (3) Heel Slides: AROM;Left;Other reps (comment);Supine (3) Hip ABduction/ADduction: AROM;Left;Other reps (comment);Supine (3) Straight Leg Raises: AROM;Left;Other reps (comment);Supine (3)   Assessment/Plan    PT Assessment Patient needs continued PT services  PT Problem List Decreased strength;Decreased activity tolerance;Decreased range of motion;Decreased balance;Decreased mobility;Decreased knowledge of use of DME;Decreased knowledge of precautions       PT Treatment Interventions DME instruction;Gait training;Functional mobility training;Stair training;Therapeutic activities;Therapeutic exercise;Balance training;Patient/family education    PT Goals (Current goals can be found in the Care Plan section)  Acute Rehab PT Goals Patient Stated Goal: get home and back to walkign in garden PT Goal Formulation: With patient Time For Goal Achievement: 02/28/20 Potential to Achieve Goals: Good    Frequency 7X/week   Barriers to discharge        Co-evaluation               AM-PAC  PT "6 Clicks" Mobility  Outcome Measure Help needed turning from your back to your side while in a flat bed without using bedrails?: None Help needed moving from lying on your back to sitting on the side of a flat bed without using bedrails?: A Little Help needed moving to and from a bed to a chair (including a wheelchair)?: A Little Help needed standing up from a chair using your arms (e.g., wheelchair or bedside chair)?: A Little Help needed to walk in hospital room?: A Little Help needed climbing 3-5 steps with a railing? : A Lot 6 Click Score: 18    End of Session Equipment Utilized During Treatment: Gait belt Activity Tolerance: Patient tolerated treatment well Patient left: in bed;with call bell/phone within reach;with family/visitor present Nurse Communication: Mobility status PT Visit Diagnosis: Muscle weakness (generalized) (M62.81);History of falling (Z91.81);Difficulty in walking, not elsewhere classified (R26.2)    Time: 7654-6503 PT Time Calculation (min) (ACUTE ONLY): 48 min   Charges:   PT Evaluation $PT Eval Low Complexity: 1 Low PT Treatments $Gait Training: 8-22 mins $Therapeutic Exercise: 8-22 mins       Verner Mould, DPT Acute Rehabilitation Services  Office [phone] Pager [phone]  02/21/2020 2:35 PM

## 2020-02-21 NOTE — Op Note (Addendum)
UNI KNEE REPLACEMENT OPERATIVE NOTE:  02/21/2020  10:53 AM  PATIENT:  Kimberly Lester  86 y.o. female  PRE-OPERATIVE DIAGNOSIS:  Osteoarthritis of left knee M17.12  POST-OPERATIVE DIAGNOSIS:  Osteoarthritis of left knee M17.12  PROCEDURE:  Procedure(s): UNICOMPARTMENTAL KNEE MEDIAL COMPARTMENT  SURGEON:  Surgeon(s): Vickey Huger, MD  PHYSICIAN ASSISTANT: Carlyon Shadow PA-C   ANESTHESIA:   spinal  DRAINS: Hemovac  SPECIMEN: None  COUNTS:  Correct  TOURNIQUET:   Total Tourniquet Time Documented: Thigh (Left) - 31 minutes Total: Thigh (Left) - 31 minutes   DICTATION:  Indication for procedure:    The patient is a 86 y.o. female who has failed conservative treatment for Osteoarthritis of left knee M17.12.  Informed consent was obtained prior to anesthesia. The risks versus benefits of the operation were explain and in a way the patient can, and did, understand.   Description of procedure:     The patient was taken to the operating room and placed under anesthesia.  The patient was positioned in the usual fashion taking care that all body parts were adequately padded and/or protected.  I foley catheter was placed.  A tourniquet was applied and the leg prepped and draped in the usual sterile fashion.  The extremity was exsanguinated with the esmarch and tourniquet inflated to 250 mmHg.  Pre-operative range of motion was normal.  The knee was in 6 degree of mild varus.  A midline incision approximately 3-4 inches long was made with a #10 blade.  A new blade was used to make a parapatellar arthrotomy going 1 cm into the quadriceps tendon, over the patella, and alongside the medial aspect of the patellar tendon.  A synovectomy was then performed with the #10 blade and forceps. I then elevated the deep MCL off the medial tibial flare. The knee was put at 90 degrees and the patient specific cutting blocks were used to make our proximal tibial cut and distal femoral cut. The medial  meniscus was removed at this point.  I then used the 2 cutting guide on the medial femoral condyle to drill for lugs and cut the chamfers. Likewise, a 2 tibial baseplate was used to prepare the medial tibia. I then trialed the  2  femur and 2 tibia. I trialed several poly inserts and a 9 mm achieved good balance in flexion and extension.  I then irrigated copiously and then mixed the cement. I injected exparel in the deep soft tissues at this point. I then cemented the tibia first followed by the femur and removed excess cement and then inserted the polyethylene. I placed the leg in extension and finished injecting the rest of the exparel. Once the cement was hard, the tourniquet was let down. Hemostasis was obtained. The arthrotomy was closed using a #1 stratofix running suture. The deep soft tissue was closed with #0 vicryls and the subcuticular layer closed with a #2.0 vicryl. The skin was reapproximated and closed using a running 3.0 monocryl. The wound was covered with steristrips, aquacel dressing, and a TED stocking. The patient was awakened, extubated, and taken to recovery in a stable condition.   BLOOD LOSS:  433IR COMPLICATIONS:  None.  PLAN OF CARE: Discharge to home after PACU  PATIENT DISPOSITION:  PACU - hemodynamically stable.    Please fax a copy of this op note to my office at [phone] (please only include page 1 and 2 of the Case Information op note)

## 2020-02-21 NOTE — H&P (Signed)
MARINE LEZOTTE MRN:  [id number] DOB/SEX:  Apr 30, 1931/female  CHIEF COMPLAINT:  Painful left Knee  HISTORY: Patient is a 86 y.o. female presented with a history of pain in the left knee. Onset of symptoms was gradual starting a few years ago with gradually worsening course since that time. Patient has been treated conservatively with over-the-counter NSAIDs and activity modification. Patient currently rates pain in the knee at 10 out of 10 with activity. There is pain at night.  PAST MEDICAL HISTORY: Patient Active Problem List   Diagnosis Date Noted  . Preoperative cardiovascular examination 10/12/2019  . History of stroke 10/12/2019  . Bilateral carotid bruits 10/12/2019  . Primary osteoarthritis of left hip 06/25/2017  . Essential hypertension 06/06/2017  . Hyperlipidemia 06/06/2017  . Abnormal EKG 06/06/2017  . Preoperative clearance 06/06/2017  . Osteoarthritis of left hip 06/04/2017  . 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 Osteoarthritis of finger 07/23/2011  . CVA (cerebral infarction) 06/19/2011   Past Medical History:  Diagnosis Date  . GERD (gastroesophageal reflux disease)   . Hypercholesterolemia   . Hypertension   . Osteoarthritis   . Restless leg syndrome   . Stroke ([place]) 06/16/2011   some balance issues, finger numbness  . TIA (transient ischemic attack) 07/20/2011   Past Surgical History:  Procedure Laterality Date  . JOINT REPLACEMENT    . TOTAL HIP ARTHROPLASTY Right 07/12/2010   at [place]  . TOTAL HIP ARTHROPLASTY Left 06/25/2017  . TOTAL HIP ARTHROPLASTY Left 06/25/2017   Procedure: TOTAL HIP ARTHROPLASTY ANTERIOR APPROACH;  Surgeon: Frederik Pear, MD;  Location: [place];  Service: Orthopedics;  Laterality: Left;     MEDICATIONS:   Medications Prior to Admission  Medication Sig Dispense Refill Last Dose  . acetaminophen (TYLENOL) 500 MG tablet Take 500 mg by mouth every 6 (six) hours as needed for mild pain.      Marland Kitchen  amLODipine (NORVASC) 5 MG tablet Take 5 mg by mouth daily.     Marland Kitchen aspirin 325 MG EC tablet Take 325 mg by mouth at bedtime.     . clopidogrel (PLAVIX) 75 MG tablet Take 1 tablet (75 mg total) by mouth daily. 30 tablet 1   . DULoxetine (CYMBALTA) 30 MG capsule Take 30 mg by mouth 2 (two) times daily.     Marland Kitchen gabapentin (NEURONTIN) 100 MG capsule Take 200 mg by mouth at bedtime.     . hydrochlorothiazide (HYDRODIURIL) 25 MG tablet Take 25 mg by mouth daily.     . lansoprazole (PREVACID) 30 MG capsule Take 30 mg by mouth every other day. In the evening.     . meclizine (ANTIVERT) 25 MG tablet Take 25 mg by mouth 3 (three) times daily as needed for dizziness (vertigo).     Marland Kitchen methyl salicylate-menthol (BENGAY) ointment Apply 1 application topically as needed for pain. For muscle/joint pain      . metoprolol succinate (TOPROL-XL) 50 MG 24 hr tablet Take 1 tablet (50 mg total) by mouth 2 (two) times daily. Take with or immediately following a meal. 60 tablet 1   . psyllium (METAMUCIL SMOOTH TEXTURE) 28 % packet Take 1 packet by mouth daily as needed (regularity).      Marland Kitchen rOPINIRole (REQUIP) 1 MG tablet Take 2 mg by mouth at bedtime.     . rosuvastatin (CRESTOR) 40 MG tablet Take 40 mg by mouth daily.      . traMADol (ULTRAM) 50 MG tablet Take 50 mg by mouth every  6 (six) hours as needed for moderate pain.        ALLERGIES:   Allergies  Allergen Reactions  . Lorazepam Other (See Comments)    "makes her crazy"  . Percocet [Oxycodone-Acetaminophen] Nausea And Vomiting    REVIEW OF SYSTEMS:  A comprehensive review of systems was negative except for: Musculoskeletal: positive for arthralgias and bone pain   FAMILY HISTORY:   Family History  Problem Relation Age of Onset  . Tuberculosis Mother   . COPD Father   . Coronary artery disease Father     SOCIAL HISTORY:   Social History   Tobacco Use  . Smoking status: Never Smoker  . Smokeless tobacco: Never Used  Substance Use Topics  . Alcohol  use: No     EXAMINATION:  Vital signs in last 24 hours:    There were no vitals taken for this visit.  General Appearance:    Alert, cooperative, no distress, appears stated age  Head:    Normocephalic, without obvious abnormality, atraumatic  Eyes:    PERRL, conjunctiva/corneas clear, EOM's intact, fundi    benign, both eyes  Ears:    Normal TM's and external ear canals, both ears  Nose:   Nares normal, septum midline, mucosa normal, no drainage    or sinus tenderness  Throat:   Lips, mucosa, and tongue normal; teeth and gums normal  Neck:   Supple, symmetrical, trachea midline, no adenopathy;    thyroid:  no enlargement/tenderness/nodules; no carotid   bruit or JVD  Back:     Symmetric, no curvature, ROM normal, no CVA tenderness  Lungs:     Clear to auscultation bilaterally, respirations unlabored  Chest Wall:    No tenderness or deformity   Heart:    Regular rate and rhythm, S1 and S2 normal, no murmur, rub   or gallop  Breast Exam:    No tenderness, masses, or nipple abnormality  Abdomen:     Soft, non-tender, bowel sounds active all four quadrants,    no masses, no organomegaly  Genitalia:    Normal female without lesion, discharge or tenderness  Rectal:    Normal tone,  no masses or tenderness;   guaiac negative stool  Extremities:   Extremities normal, atraumatic, no cyanosis or edema  Pulses:   2+ and symmetric all extremities  Skin:   Skin color, texture, turgor normal, no rashes or lesions  Lymph nodes:   Cervical, supraclavicular, and axillary nodes normal  Neurologic:   CNII-XII intact, normal strength, sensation and reflexes    throughout    Musculoskeletal:  ROM 0-120, Ligaments intact,  Imaging Review Plain radiographs demonstrate severe degenerative joint disease of the left knee. The overall alignment is mild varus. The bone quality appears to be good for age and reported activity level.  Assessment/Plan: Primary osteoarthritis, left knee   The patient  history, physical examination and imaging studies are consistent with advanced degenerative joint disease of the left knee. The patient has failed conservative treatment.  The clearance notes were reviewed.  After discussion with the patient it was felt that Unicompartmental Knee Replacement was indicated. The procedure,  risks, and benefits of unicompartmental knee arthroplasty were presented and reviewed. The risks including but not limited to aseptic loosening, infection, blood clots, vascular injury, stiffness, patella tracking problems complications among others were discussed. The patient acknowledged the explanation, agreed to proceed with the plan.  Preoperative templating of the joint replacement has been completed, documented, and submitted to the Operating  Room personnel in order to optimize intra-operative equipment management.    Patient's anticipated LOS is less than 2 midnights, meeting these requirements: - Lives within 1 hour of care - Has a competent adult at home to recover with post-op recover - NO history of  - Chronic pain requiring opiods  - Diabetes  - Coronary Artery Disease  - Heart failure  - Heart attack  - Stroke  - DVT/VTE  - Cardiac arrhythmia  - Respiratory Failure/COPD  - Renal failure  - Anemia  - Advanced Liver disease       Donia Ast 02/21/2020, 7:02 AM

## 2020-02-21 NOTE — Anesthesia Procedure Notes (Signed)
Anesthesia Regional Block: Adductor canal block   Pre-Anesthetic Checklist: ,, timeout performed, Correct Patient, Correct Site, Correct Laterality, Correct Procedure, Correct Position, site marked, Risks and benefits discussed,  Surgical consent,  Pre-op evaluation,  At surgeon's request and post-op pain management  Laterality: Left  Prep: chloraprep       Needles:  Injection technique: Single-shot  Needle Type: Echogenic Needle     Needle Length: 9cm      Additional Needles:   Procedures:,,,, ultrasound used (permanent image in chart),,,,  Narrative:  Start time: 02/21/2020 8:52 AM End time: 02/21/2020 8:58 AM Injection made incrementally with aspirations every 5 mL.  Performed by: Personally  Anesthesiologist: Myrtie Soman, MD  Additional Notes: Patient tolerated the procedure well without complications

## 2020-02-21 NOTE — Anesthesia Procedure Notes (Signed)
Anesthesia Procedure Image    

## 2020-02-21 NOTE — Anesthesia Procedure Notes (Signed)
Date/Time: 02/21/2020 9:32 AM Performed by: Sharlette Dense, CRNA Oxygen Delivery Method: Simple face mask

## 2020-02-28 DIAGNOSIS — M6281 Muscle weakness (generalized): Secondary | ICD-10-CM | POA: Diagnosis not present

## 2020-02-28 DIAGNOSIS — M25562 Pain in left knee: Secondary | ICD-10-CM | POA: Diagnosis not present

## 2020-02-28 DIAGNOSIS — M25662 Stiffness of left knee, not elsewhere classified: Secondary | ICD-10-CM | POA: Diagnosis not present

## 2020-02-29 DIAGNOSIS — Z96652 Presence of left artificial knee joint: Secondary | ICD-10-CM | POA: Insufficient documentation

## 2020-02-29 DIAGNOSIS — M25462 Effusion, left knee: Secondary | ICD-10-CM | POA: Diagnosis not present

## 2020-02-29 DIAGNOSIS — I708 Atherosclerosis of other arteries: Secondary | ICD-10-CM | POA: Diagnosis not present

## 2020-02-29 DIAGNOSIS — Z471 Aftercare following joint replacement surgery: Secondary | ICD-10-CM | POA: Diagnosis not present

## 2020-03-01 DIAGNOSIS — H25813 Combined forms of age-related cataract, bilateral: Secondary | ICD-10-CM | POA: Diagnosis not present

## 2020-03-02 ENCOUNTER — Encounter (HOSPITAL_COMMUNITY): Payer: Self-pay | Admitting: Orthopedic Surgery

## 2020-03-03 DIAGNOSIS — M25562 Pain in left knee: Secondary | ICD-10-CM | POA: Diagnosis not present

## 2020-03-03 DIAGNOSIS — M25662 Stiffness of left knee, not elsewhere classified: Secondary | ICD-10-CM | POA: Diagnosis not present

## 2020-03-03 DIAGNOSIS — M6281 Muscle weakness (generalized): Secondary | ICD-10-CM | POA: Diagnosis not present

## 2020-03-06 DIAGNOSIS — M25562 Pain in left knee: Secondary | ICD-10-CM | POA: Diagnosis not present

## 2020-03-06 DIAGNOSIS — M25662 Stiffness of left knee, not elsewhere classified: Secondary | ICD-10-CM | POA: Diagnosis not present

## 2020-03-06 DIAGNOSIS — M6281 Muscle weakness (generalized): Secondary | ICD-10-CM | POA: Diagnosis not present

## 2020-03-10 DIAGNOSIS — M25562 Pain in left knee: Secondary | ICD-10-CM | POA: Diagnosis not present

## 2020-03-10 DIAGNOSIS — M25662 Stiffness of left knee, not elsewhere classified: Secondary | ICD-10-CM | POA: Diagnosis not present

## 2020-03-10 DIAGNOSIS — M6281 Muscle weakness (generalized): Secondary | ICD-10-CM | POA: Diagnosis not present

## 2020-03-13 DIAGNOSIS — M25662 Stiffness of left knee, not elsewhere classified: Secondary | ICD-10-CM | POA: Diagnosis not present

## 2020-03-13 DIAGNOSIS — M6281 Muscle weakness (generalized): Secondary | ICD-10-CM | POA: Diagnosis not present

## 2020-03-13 DIAGNOSIS — M25562 Pain in left knee: Secondary | ICD-10-CM | POA: Diagnosis not present

## 2020-03-17 DIAGNOSIS — M6281 Muscle weakness (generalized): Secondary | ICD-10-CM | POA: Diagnosis not present

## 2020-03-17 DIAGNOSIS — M25562 Pain in left knee: Secondary | ICD-10-CM | POA: Diagnosis not present

## 2020-03-17 DIAGNOSIS — M25662 Stiffness of left knee, not elsewhere classified: Secondary | ICD-10-CM | POA: Diagnosis not present

## 2020-03-20 DIAGNOSIS — M6281 Muscle weakness (generalized): Secondary | ICD-10-CM | POA: Diagnosis not present

## 2020-03-20 DIAGNOSIS — M25662 Stiffness of left knee, not elsewhere classified: Secondary | ICD-10-CM | POA: Diagnosis not present

## 2020-03-20 DIAGNOSIS — M25562 Pain in left knee: Secondary | ICD-10-CM | POA: Diagnosis not present

## 2020-03-24 DIAGNOSIS — M25562 Pain in left knee: Secondary | ICD-10-CM | POA: Diagnosis not present

## 2020-03-24 DIAGNOSIS — M6281 Muscle weakness (generalized): Secondary | ICD-10-CM | POA: Diagnosis not present

## 2020-03-24 DIAGNOSIS — M25662 Stiffness of left knee, not elsewhere classified: Secondary | ICD-10-CM | POA: Diagnosis not present

## 2020-03-28 DIAGNOSIS — M6281 Muscle weakness (generalized): Secondary | ICD-10-CM | POA: Diagnosis not present

## 2020-03-28 DIAGNOSIS — M25562 Pain in left knee: Secondary | ICD-10-CM | POA: Diagnosis not present

## 2020-03-28 DIAGNOSIS — M25662 Stiffness of left knee, not elsewhere classified: Secondary | ICD-10-CM | POA: Diagnosis not present

## 2020-03-31 DIAGNOSIS — M25562 Pain in left knee: Secondary | ICD-10-CM | POA: Diagnosis not present

## 2020-03-31 DIAGNOSIS — M25662 Stiffness of left knee, not elsewhere classified: Secondary | ICD-10-CM | POA: Diagnosis not present

## 2020-03-31 DIAGNOSIS — M6281 Muscle weakness (generalized): Secondary | ICD-10-CM | POA: Diagnosis not present

## 2020-04-24 DIAGNOSIS — Z8673 Personal history of transient ischemic attack (TIA), and cerebral infarction without residual deficits: Secondary | ICD-10-CM | POA: Diagnosis not present

## 2020-04-24 DIAGNOSIS — K219 Gastro-esophageal reflux disease without esophagitis: Secondary | ICD-10-CM | POA: Diagnosis not present

## 2020-04-24 DIAGNOSIS — Z6828 Body mass index (BMI) 28.0-28.9, adult: Secondary | ICD-10-CM | POA: Diagnosis not present

## 2020-04-24 DIAGNOSIS — I10 Essential (primary) hypertension: Secondary | ICD-10-CM | POA: Diagnosis not present

## 2020-04-24 DIAGNOSIS — E7800 Pure hypercholesterolemia, unspecified: Secondary | ICD-10-CM | POA: Diagnosis not present

## 2020-04-24 DIAGNOSIS — M179 Osteoarthritis of knee, unspecified: Secondary | ICD-10-CM | POA: Diagnosis not present

## 2020-04-24 DIAGNOSIS — E1169 Type 2 diabetes mellitus with other specified complication: Secondary | ICD-10-CM | POA: Diagnosis not present

## 2020-04-24 DIAGNOSIS — F419 Anxiety disorder, unspecified: Secondary | ICD-10-CM | POA: Diagnosis not present

## 2020-07-04 DIAGNOSIS — L821 Other seborrheic keratosis: Secondary | ICD-10-CM | POA: Diagnosis not present

## 2020-07-04 DIAGNOSIS — L570 Actinic keratosis: Secondary | ICD-10-CM | POA: Diagnosis not present

## 2020-07-04 DIAGNOSIS — L219 Seborrheic dermatitis, unspecified: Secondary | ICD-10-CM | POA: Diagnosis not present

## 2020-07-04 DIAGNOSIS — L578 Other skin changes due to chronic exposure to nonionizing radiation: Secondary | ICD-10-CM | POA: Diagnosis not present

## 2020-07-26 DIAGNOSIS — E559 Vitamin D deficiency, unspecified: Secondary | ICD-10-CM | POA: Diagnosis not present

## 2020-07-26 DIAGNOSIS — E7800 Pure hypercholesterolemia, unspecified: Secondary | ICD-10-CM | POA: Diagnosis not present

## 2020-07-26 DIAGNOSIS — I10 Essential (primary) hypertension: Secondary | ICD-10-CM | POA: Diagnosis not present

## 2020-07-26 DIAGNOSIS — Z6829 Body mass index (BMI) 29.0-29.9, adult: Secondary | ICD-10-CM | POA: Diagnosis not present

## 2020-07-26 DIAGNOSIS — G2581 Restless legs syndrome: Secondary | ICD-10-CM | POA: Diagnosis not present

## 2020-07-26 DIAGNOSIS — Z8673 Personal history of transient ischemic attack (TIA), and cerebral infarction without residual deficits: Secondary | ICD-10-CM | POA: Diagnosis not present

## 2020-07-26 DIAGNOSIS — E1121 Type 2 diabetes mellitus with diabetic nephropathy: Secondary | ICD-10-CM | POA: Diagnosis not present

## 2020-07-26 DIAGNOSIS — Z7902 Long term (current) use of antithrombotics/antiplatelets: Secondary | ICD-10-CM | POA: Diagnosis not present

## 2020-07-26 DIAGNOSIS — E538 Deficiency of other specified B group vitamins: Secondary | ICD-10-CM | POA: Diagnosis not present

## 2020-07-26 DIAGNOSIS — Z79899 Other long term (current) drug therapy: Secondary | ICD-10-CM | POA: Diagnosis not present

## 2020-11-15 DIAGNOSIS — I10 Essential (primary) hypertension: Secondary | ICD-10-CM | POA: Diagnosis not present

## 2020-11-15 DIAGNOSIS — K219 Gastro-esophageal reflux disease without esophagitis: Secondary | ICD-10-CM | POA: Diagnosis not present

## 2020-11-15 DIAGNOSIS — Z79899 Other long term (current) drug therapy: Secondary | ICD-10-CM | POA: Diagnosis not present

## 2020-11-15 DIAGNOSIS — G2581 Restless legs syndrome: Secondary | ICD-10-CM | POA: Diagnosis not present

## 2020-11-15 DIAGNOSIS — E1121 Type 2 diabetes mellitus with diabetic nephropathy: Secondary | ICD-10-CM | POA: Diagnosis not present

## 2020-11-15 DIAGNOSIS — Z8673 Personal history of transient ischemic attack (TIA), and cerebral infarction without residual deficits: Secondary | ICD-10-CM | POA: Diagnosis not present

## 2020-11-15 DIAGNOSIS — Z0000 Encounter for general adult medical examination without abnormal findings: Secondary | ICD-10-CM | POA: Diagnosis not present

## 2020-11-15 DIAGNOSIS — E1169 Type 2 diabetes mellitus with other specified complication: Secondary | ICD-10-CM | POA: Diagnosis not present

## 2020-11-15 DIAGNOSIS — E7800 Pure hypercholesterolemia, unspecified: Secondary | ICD-10-CM | POA: Diagnosis not present

## 2020-11-15 DIAGNOSIS — Z6829 Body mass index (BMI) 29.0-29.9, adult: Secondary | ICD-10-CM | POA: Diagnosis not present

## 2020-11-15 DIAGNOSIS — F419 Anxiety disorder, unspecified: Secondary | ICD-10-CM | POA: Diagnosis not present

## 2020-12-17 DIAGNOSIS — E7800 Pure hypercholesterolemia, unspecified: Secondary | ICD-10-CM | POA: Diagnosis not present

## 2020-12-17 DIAGNOSIS — I10 Essential (primary) hypertension: Secondary | ICD-10-CM | POA: Diagnosis not present

## 2020-12-17 DIAGNOSIS — F419 Anxiety disorder, unspecified: Secondary | ICD-10-CM | POA: Diagnosis not present

## 2021-01-25 DIAGNOSIS — L570 Actinic keratosis: Secondary | ICD-10-CM | POA: Diagnosis not present

## 2021-01-25 DIAGNOSIS — L578 Other skin changes due to chronic exposure to nonionizing radiation: Secondary | ICD-10-CM | POA: Diagnosis not present

## 2021-01-25 DIAGNOSIS — L820 Inflamed seborrheic keratosis: Secondary | ICD-10-CM | POA: Diagnosis not present

## 2021-01-25 DIAGNOSIS — L821 Other seborrheic keratosis: Secondary | ICD-10-CM | POA: Diagnosis not present

## 2021-02-26 DIAGNOSIS — Z6830 Body mass index (BMI) 30.0-30.9, adult: Secondary | ICD-10-CM | POA: Diagnosis not present

## 2021-02-26 DIAGNOSIS — Z79899 Other long term (current) drug therapy: Secondary | ICD-10-CM | POA: Diagnosis not present

## 2021-02-26 DIAGNOSIS — E669 Obesity, unspecified: Secondary | ICD-10-CM | POA: Diagnosis not present

## 2021-02-26 DIAGNOSIS — E7800 Pure hypercholesterolemia, unspecified: Secondary | ICD-10-CM | POA: Diagnosis not present

## 2021-02-26 DIAGNOSIS — Z23 Encounter for immunization: Secondary | ICD-10-CM | POA: Diagnosis not present

## 2021-02-26 DIAGNOSIS — E1121 Type 2 diabetes mellitus with diabetic nephropathy: Secondary | ICD-10-CM | POA: Diagnosis not present

## 2021-02-26 DIAGNOSIS — I10 Essential (primary) hypertension: Secondary | ICD-10-CM | POA: Diagnosis not present

## 2021-02-26 DIAGNOSIS — G2581 Restless legs syndrome: Secondary | ICD-10-CM | POA: Diagnosis not present

## 2021-02-26 DIAGNOSIS — Z7902 Long term (current) use of antithrombotics/antiplatelets: Secondary | ICD-10-CM | POA: Diagnosis not present

## 2021-02-26 DIAGNOSIS — E538 Deficiency of other specified B group vitamins: Secondary | ICD-10-CM | POA: Diagnosis not present

## 2021-03-09 DIAGNOSIS — H25813 Combined forms of age-related cataract, bilateral: Secondary | ICD-10-CM | POA: Diagnosis not present

## 2021-05-24 DIAGNOSIS — Z96652 Presence of left artificial knee joint: Secondary | ICD-10-CM | POA: Diagnosis not present

## 2021-05-30 DIAGNOSIS — M6281 Muscle weakness (generalized): Secondary | ICD-10-CM | POA: Diagnosis not present

## 2021-05-30 DIAGNOSIS — R2689 Other abnormalities of gait and mobility: Secondary | ICD-10-CM | POA: Diagnosis not present

## 2021-06-01 DIAGNOSIS — Z96652 Presence of left artificial knee joint: Secondary | ICD-10-CM | POA: Diagnosis not present

## 2021-06-01 DIAGNOSIS — Z96649 Presence of unspecified artificial hip joint: Secondary | ICD-10-CM | POA: Diagnosis not present

## 2021-06-01 DIAGNOSIS — R2689 Other abnormalities of gait and mobility: Secondary | ICD-10-CM | POA: Diagnosis not present

## 2021-06-01 DIAGNOSIS — M6281 Muscle weakness (generalized): Secondary | ICD-10-CM | POA: Diagnosis not present

## 2021-06-01 DIAGNOSIS — I639 Cerebral infarction, unspecified: Secondary | ICD-10-CM | POA: Diagnosis not present

## 2021-06-04 DIAGNOSIS — Z96649 Presence of unspecified artificial hip joint: Secondary | ICD-10-CM | POA: Diagnosis not present

## 2021-06-04 DIAGNOSIS — R2689 Other abnormalities of gait and mobility: Secondary | ICD-10-CM | POA: Diagnosis not present

## 2021-06-04 DIAGNOSIS — M6281 Muscle weakness (generalized): Secondary | ICD-10-CM | POA: Diagnosis not present

## 2021-06-04 DIAGNOSIS — I639 Cerebral infarction, unspecified: Secondary | ICD-10-CM | POA: Diagnosis not present

## 2021-06-04 DIAGNOSIS — Z96652 Presence of left artificial knee joint: Secondary | ICD-10-CM | POA: Diagnosis not present

## 2021-06-08 DIAGNOSIS — M6281 Muscle weakness (generalized): Secondary | ICD-10-CM | POA: Diagnosis not present

## 2021-06-08 DIAGNOSIS — Z96652 Presence of left artificial knee joint: Secondary | ICD-10-CM | POA: Diagnosis not present

## 2021-06-08 DIAGNOSIS — R2689 Other abnormalities of gait and mobility: Secondary | ICD-10-CM | POA: Diagnosis not present

## 2021-06-08 DIAGNOSIS — I639 Cerebral infarction, unspecified: Secondary | ICD-10-CM | POA: Diagnosis not present

## 2021-06-08 DIAGNOSIS — Z96649 Presence of unspecified artificial hip joint: Secondary | ICD-10-CM | POA: Diagnosis not present

## 2021-06-12 DIAGNOSIS — R2689 Other abnormalities of gait and mobility: Secondary | ICD-10-CM | POA: Diagnosis not present

## 2021-06-12 DIAGNOSIS — M6281 Muscle weakness (generalized): Secondary | ICD-10-CM | POA: Diagnosis not present

## 2021-06-12 DIAGNOSIS — Z96652 Presence of left artificial knee joint: Secondary | ICD-10-CM | POA: Diagnosis not present

## 2021-06-12 DIAGNOSIS — Z96649 Presence of unspecified artificial hip joint: Secondary | ICD-10-CM | POA: Diagnosis not present

## 2021-06-12 DIAGNOSIS — I639 Cerebral infarction, unspecified: Secondary | ICD-10-CM | POA: Diagnosis not present

## 2021-06-15 DIAGNOSIS — M6281 Muscle weakness (generalized): Secondary | ICD-10-CM | POA: Diagnosis not present

## 2021-06-15 DIAGNOSIS — Z96652 Presence of left artificial knee joint: Secondary | ICD-10-CM | POA: Diagnosis not present

## 2021-06-15 DIAGNOSIS — I639 Cerebral infarction, unspecified: Secondary | ICD-10-CM | POA: Diagnosis not present

## 2021-06-15 DIAGNOSIS — R2689 Other abnormalities of gait and mobility: Secondary | ICD-10-CM | POA: Diagnosis not present

## 2021-06-15 DIAGNOSIS — Z96649 Presence of unspecified artificial hip joint: Secondary | ICD-10-CM | POA: Diagnosis not present

## 2021-06-17 DIAGNOSIS — E7800 Pure hypercholesterolemia, unspecified: Secondary | ICD-10-CM | POA: Diagnosis not present

## 2021-06-17 DIAGNOSIS — E1169 Type 2 diabetes mellitus with other specified complication: Secondary | ICD-10-CM | POA: Diagnosis not present

## 2021-06-17 DIAGNOSIS — I10 Essential (primary) hypertension: Secondary | ICD-10-CM | POA: Diagnosis not present

## 2021-06-17 DIAGNOSIS — Z8673 Personal history of transient ischemic attack (TIA), and cerebral infarction without residual deficits: Secondary | ICD-10-CM | POA: Diagnosis not present

## 2021-06-18 DIAGNOSIS — M6281 Muscle weakness (generalized): Secondary | ICD-10-CM | POA: Diagnosis not present

## 2021-06-18 DIAGNOSIS — Z96649 Presence of unspecified artificial hip joint: Secondary | ICD-10-CM | POA: Diagnosis not present

## 2021-06-18 DIAGNOSIS — Z96652 Presence of left artificial knee joint: Secondary | ICD-10-CM | POA: Diagnosis not present

## 2021-06-18 DIAGNOSIS — R2689 Other abnormalities of gait and mobility: Secondary | ICD-10-CM | POA: Diagnosis not present

## 2021-06-18 DIAGNOSIS — I639 Cerebral infarction, unspecified: Secondary | ICD-10-CM | POA: Diagnosis not present

## 2021-06-19 DIAGNOSIS — G2581 Restless legs syndrome: Secondary | ICD-10-CM | POA: Diagnosis not present

## 2021-06-19 DIAGNOSIS — Z6829 Body mass index (BMI) 29.0-29.9, adult: Secondary | ICD-10-CM | POA: Diagnosis not present

## 2021-06-19 DIAGNOSIS — Z7902 Long term (current) use of antithrombotics/antiplatelets: Secondary | ICD-10-CM | POA: Diagnosis not present

## 2021-06-19 DIAGNOSIS — E663 Overweight: Secondary | ICD-10-CM | POA: Diagnosis not present

## 2021-06-19 DIAGNOSIS — I10 Essential (primary) hypertension: Secondary | ICD-10-CM | POA: Diagnosis not present

## 2021-06-19 DIAGNOSIS — E7800 Pure hypercholesterolemia, unspecified: Secondary | ICD-10-CM | POA: Diagnosis not present

## 2021-06-19 DIAGNOSIS — E1121 Type 2 diabetes mellitus with diabetic nephropathy: Secondary | ICD-10-CM | POA: Diagnosis not present

## 2021-06-19 DIAGNOSIS — Z79899 Other long term (current) drug therapy: Secondary | ICD-10-CM | POA: Diagnosis not present

## 2021-06-19 DIAGNOSIS — E538 Deficiency of other specified B group vitamins: Secondary | ICD-10-CM | POA: Diagnosis not present

## 2021-06-21 DIAGNOSIS — Z96652 Presence of left artificial knee joint: Secondary | ICD-10-CM | POA: Diagnosis not present

## 2021-06-21 DIAGNOSIS — M6281 Muscle weakness (generalized): Secondary | ICD-10-CM | POA: Diagnosis not present

## 2021-06-21 DIAGNOSIS — Z96649 Presence of unspecified artificial hip joint: Secondary | ICD-10-CM | POA: Diagnosis not present

## 2021-06-21 DIAGNOSIS — R2689 Other abnormalities of gait and mobility: Secondary | ICD-10-CM | POA: Diagnosis not present

## 2021-06-21 DIAGNOSIS — I639 Cerebral infarction, unspecified: Secondary | ICD-10-CM | POA: Diagnosis not present

## 2021-06-25 DIAGNOSIS — I639 Cerebral infarction, unspecified: Secondary | ICD-10-CM | POA: Diagnosis not present

## 2021-06-25 DIAGNOSIS — R2689 Other abnormalities of gait and mobility: Secondary | ICD-10-CM | POA: Diagnosis not present

## 2021-06-25 DIAGNOSIS — M6281 Muscle weakness (generalized): Secondary | ICD-10-CM | POA: Diagnosis not present

## 2021-06-25 DIAGNOSIS — Z96649 Presence of unspecified artificial hip joint: Secondary | ICD-10-CM | POA: Diagnosis not present

## 2021-06-25 DIAGNOSIS — Z96652 Presence of left artificial knee joint: Secondary | ICD-10-CM | POA: Diagnosis not present

## 2021-06-29 DIAGNOSIS — R2689 Other abnormalities of gait and mobility: Secondary | ICD-10-CM | POA: Diagnosis not present

## 2021-06-29 DIAGNOSIS — I639 Cerebral infarction, unspecified: Secondary | ICD-10-CM | POA: Diagnosis not present

## 2021-06-29 DIAGNOSIS — Z96649 Presence of unspecified artificial hip joint: Secondary | ICD-10-CM | POA: Diagnosis not present

## 2021-06-29 DIAGNOSIS — M6281 Muscle weakness (generalized): Secondary | ICD-10-CM | POA: Diagnosis not present

## 2021-06-29 DIAGNOSIS — Z96652 Presence of left artificial knee joint: Secondary | ICD-10-CM | POA: Diagnosis not present

## 2021-07-02 DIAGNOSIS — Z96649 Presence of unspecified artificial hip joint: Secondary | ICD-10-CM | POA: Diagnosis not present

## 2021-07-02 DIAGNOSIS — R2689 Other abnormalities of gait and mobility: Secondary | ICD-10-CM | POA: Diagnosis not present

## 2021-07-02 DIAGNOSIS — M6281 Muscle weakness (generalized): Secondary | ICD-10-CM | POA: Diagnosis not present

## 2021-07-02 DIAGNOSIS — Z96652 Presence of left artificial knee joint: Secondary | ICD-10-CM | POA: Diagnosis not present

## 2021-07-02 DIAGNOSIS — I639 Cerebral infarction, unspecified: Secondary | ICD-10-CM | POA: Diagnosis not present

## 2021-07-05 DIAGNOSIS — L578 Other skin changes due to chronic exposure to nonionizing radiation: Secondary | ICD-10-CM | POA: Diagnosis not present

## 2021-07-05 DIAGNOSIS — C44319 Basal cell carcinoma of skin of other parts of face: Secondary | ICD-10-CM | POA: Diagnosis not present

## 2021-07-05 DIAGNOSIS — C44722 Squamous cell carcinoma of skin of right lower limb, including hip: Secondary | ICD-10-CM | POA: Diagnosis not present

## 2021-07-05 DIAGNOSIS — L821 Other seborrheic keratosis: Secondary | ICD-10-CM | POA: Diagnosis not present

## 2021-07-05 DIAGNOSIS — L570 Actinic keratosis: Secondary | ICD-10-CM | POA: Diagnosis not present

## 2021-07-06 DIAGNOSIS — Z96652 Presence of left artificial knee joint: Secondary | ICD-10-CM | POA: Diagnosis not present

## 2021-07-06 DIAGNOSIS — M6281 Muscle weakness (generalized): Secondary | ICD-10-CM | POA: Diagnosis not present

## 2021-07-06 DIAGNOSIS — I639 Cerebral infarction, unspecified: Secondary | ICD-10-CM | POA: Diagnosis not present

## 2021-07-06 DIAGNOSIS — Z96649 Presence of unspecified artificial hip joint: Secondary | ICD-10-CM | POA: Diagnosis not present

## 2021-07-06 DIAGNOSIS — R2689 Other abnormalities of gait and mobility: Secondary | ICD-10-CM | POA: Diagnosis not present

## 2021-07-09 DIAGNOSIS — M6281 Muscle weakness (generalized): Secondary | ICD-10-CM | POA: Diagnosis not present

## 2021-07-09 DIAGNOSIS — I639 Cerebral infarction, unspecified: Secondary | ICD-10-CM | POA: Diagnosis not present

## 2021-07-09 DIAGNOSIS — Z96649 Presence of unspecified artificial hip joint: Secondary | ICD-10-CM | POA: Diagnosis not present

## 2021-07-09 DIAGNOSIS — R2689 Other abnormalities of gait and mobility: Secondary | ICD-10-CM | POA: Diagnosis not present

## 2021-07-09 DIAGNOSIS — Z96652 Presence of left artificial knee joint: Secondary | ICD-10-CM | POA: Diagnosis not present

## 2021-07-16 DIAGNOSIS — I639 Cerebral infarction, unspecified: Secondary | ICD-10-CM | POA: Diagnosis not present

## 2021-07-16 DIAGNOSIS — Z96649 Presence of unspecified artificial hip joint: Secondary | ICD-10-CM | POA: Diagnosis not present

## 2021-07-16 DIAGNOSIS — M6281 Muscle weakness (generalized): Secondary | ICD-10-CM | POA: Diagnosis not present

## 2021-07-16 DIAGNOSIS — Z96652 Presence of left artificial knee joint: Secondary | ICD-10-CM | POA: Diagnosis not present

## 2021-07-16 DIAGNOSIS — R2689 Other abnormalities of gait and mobility: Secondary | ICD-10-CM | POA: Diagnosis not present

## 2021-07-20 DIAGNOSIS — I639 Cerebral infarction, unspecified: Secondary | ICD-10-CM | POA: Diagnosis not present

## 2021-07-20 DIAGNOSIS — R2689 Other abnormalities of gait and mobility: Secondary | ICD-10-CM | POA: Diagnosis not present

## 2021-07-20 DIAGNOSIS — Z96652 Presence of left artificial knee joint: Secondary | ICD-10-CM | POA: Diagnosis not present

## 2021-07-20 DIAGNOSIS — M6281 Muscle weakness (generalized): Secondary | ICD-10-CM | POA: Diagnosis not present

## 2021-07-20 DIAGNOSIS — Z96649 Presence of unspecified artificial hip joint: Secondary | ICD-10-CM | POA: Diagnosis not present

## 2021-07-23 DIAGNOSIS — I639 Cerebral infarction, unspecified: Secondary | ICD-10-CM | POA: Diagnosis not present

## 2021-07-23 DIAGNOSIS — R2689 Other abnormalities of gait and mobility: Secondary | ICD-10-CM | POA: Diagnosis not present

## 2021-07-23 DIAGNOSIS — M6281 Muscle weakness (generalized): Secondary | ICD-10-CM | POA: Diagnosis not present

## 2021-07-23 DIAGNOSIS — Z96649 Presence of unspecified artificial hip joint: Secondary | ICD-10-CM | POA: Diagnosis not present

## 2021-07-23 DIAGNOSIS — Z96652 Presence of left artificial knee joint: Secondary | ICD-10-CM | POA: Diagnosis not present

## 2021-07-24 DIAGNOSIS — C44319 Basal cell carcinoma of skin of other parts of face: Secondary | ICD-10-CM | POA: Diagnosis not present

## 2021-07-27 DIAGNOSIS — Z96652 Presence of left artificial knee joint: Secondary | ICD-10-CM | POA: Diagnosis not present

## 2021-07-27 DIAGNOSIS — Z96649 Presence of unspecified artificial hip joint: Secondary | ICD-10-CM | POA: Diagnosis not present

## 2021-07-27 DIAGNOSIS — R2689 Other abnormalities of gait and mobility: Secondary | ICD-10-CM | POA: Diagnosis not present

## 2021-07-27 DIAGNOSIS — M6281 Muscle weakness (generalized): Secondary | ICD-10-CM | POA: Diagnosis not present

## 2021-07-27 DIAGNOSIS — I639 Cerebral infarction, unspecified: Secondary | ICD-10-CM | POA: Diagnosis not present

## 2021-08-03 DIAGNOSIS — I639 Cerebral infarction, unspecified: Secondary | ICD-10-CM | POA: Diagnosis not present

## 2021-08-03 DIAGNOSIS — R2689 Other abnormalities of gait and mobility: Secondary | ICD-10-CM | POA: Diagnosis not present

## 2021-08-03 DIAGNOSIS — Z96649 Presence of unspecified artificial hip joint: Secondary | ICD-10-CM | POA: Diagnosis not present

## 2021-08-03 DIAGNOSIS — Z96652 Presence of left artificial knee joint: Secondary | ICD-10-CM | POA: Diagnosis not present

## 2021-08-03 DIAGNOSIS — M6281 Muscle weakness (generalized): Secondary | ICD-10-CM | POA: Diagnosis not present

## 2021-08-10 DIAGNOSIS — M6281 Muscle weakness (generalized): Secondary | ICD-10-CM | POA: Diagnosis not present

## 2021-08-10 DIAGNOSIS — R2689 Other abnormalities of gait and mobility: Secondary | ICD-10-CM | POA: Diagnosis not present

## 2021-08-10 DIAGNOSIS — I639 Cerebral infarction, unspecified: Secondary | ICD-10-CM | POA: Diagnosis not present

## 2021-08-10 DIAGNOSIS — Z96649 Presence of unspecified artificial hip joint: Secondary | ICD-10-CM | POA: Diagnosis not present

## 2021-08-10 DIAGNOSIS — Z96652 Presence of left artificial knee joint: Secondary | ICD-10-CM | POA: Diagnosis not present

## 2021-08-17 DIAGNOSIS — R2689 Other abnormalities of gait and mobility: Secondary | ICD-10-CM | POA: Diagnosis not present

## 2021-08-17 DIAGNOSIS — I639 Cerebral infarction, unspecified: Secondary | ICD-10-CM | POA: Diagnosis not present

## 2021-08-17 DIAGNOSIS — Z96652 Presence of left artificial knee joint: Secondary | ICD-10-CM | POA: Diagnosis not present

## 2021-08-17 DIAGNOSIS — M6281 Muscle weakness (generalized): Secondary | ICD-10-CM | POA: Diagnosis not present

## 2021-08-17 DIAGNOSIS — Z96649 Presence of unspecified artificial hip joint: Secondary | ICD-10-CM | POA: Diagnosis not present

## 2021-10-02 DIAGNOSIS — L820 Inflamed seborrheic keratosis: Secondary | ICD-10-CM | POA: Diagnosis not present

## 2021-10-02 DIAGNOSIS — E1121 Type 2 diabetes mellitus with diabetic nephropathy: Secondary | ICD-10-CM | POA: Diagnosis not present

## 2021-10-02 DIAGNOSIS — L578 Other skin changes due to chronic exposure to nonionizing radiation: Secondary | ICD-10-CM | POA: Diagnosis not present

## 2021-10-02 DIAGNOSIS — I10 Essential (primary) hypertension: Secondary | ICD-10-CM | POA: Diagnosis not present

## 2021-10-02 DIAGNOSIS — Z8673 Personal history of transient ischemic attack (TIA), and cerebral infarction without residual deficits: Secondary | ICD-10-CM | POA: Diagnosis not present

## 2021-10-02 DIAGNOSIS — E7800 Pure hypercholesterolemia, unspecified: Secondary | ICD-10-CM | POA: Diagnosis not present

## 2021-10-02 DIAGNOSIS — G2581 Restless legs syndrome: Secondary | ICD-10-CM | POA: Diagnosis not present

## 2021-10-02 DIAGNOSIS — E538 Deficiency of other specified B group vitamins: Secondary | ICD-10-CM | POA: Diagnosis not present

## 2021-10-02 DIAGNOSIS — K219 Gastro-esophageal reflux disease without esophagitis: Secondary | ICD-10-CM | POA: Diagnosis not present

## 2021-10-02 DIAGNOSIS — L570 Actinic keratosis: Secondary | ICD-10-CM | POA: Diagnosis not present

## 2021-10-02 DIAGNOSIS — Z6829 Body mass index (BMI) 29.0-29.9, adult: Secondary | ICD-10-CM | POA: Diagnosis not present

## 2021-10-02 DIAGNOSIS — E663 Overweight: Secondary | ICD-10-CM | POA: Diagnosis not present

## 2021-10-02 DIAGNOSIS — C44319 Basal cell carcinoma of skin of other parts of face: Secondary | ICD-10-CM | POA: Diagnosis not present

## 2021-11-27 DIAGNOSIS — I10 Essential (primary) hypertension: Secondary | ICD-10-CM | POA: Diagnosis not present

## 2021-11-27 DIAGNOSIS — E7800 Pure hypercholesterolemia, unspecified: Secondary | ICD-10-CM | POA: Diagnosis not present

## 2021-11-27 DIAGNOSIS — K219 Gastro-esophageal reflux disease without esophagitis: Secondary | ICD-10-CM | POA: Diagnosis not present

## 2021-11-27 DIAGNOSIS — F419 Anxiety disorder, unspecified: Secondary | ICD-10-CM | POA: Diagnosis not present

## 2021-11-27 DIAGNOSIS — E1121 Type 2 diabetes mellitus with diabetic nephropathy: Secondary | ICD-10-CM | POA: Diagnosis not present

## 2021-11-27 DIAGNOSIS — Z96643 Presence of artificial hip joint, bilateral: Secondary | ICD-10-CM | POA: Diagnosis not present

## 2021-11-27 DIAGNOSIS — G2581 Restless legs syndrome: Secondary | ICD-10-CM | POA: Diagnosis not present

## 2021-11-27 DIAGNOSIS — E1169 Type 2 diabetes mellitus with other specified complication: Secondary | ICD-10-CM | POA: Diagnosis not present

## 2021-11-27 DIAGNOSIS — Z8673 Personal history of transient ischemic attack (TIA), and cerebral infarction without residual deficits: Secondary | ICD-10-CM | POA: Diagnosis not present

## 2021-11-27 DIAGNOSIS — Z0000 Encounter for general adult medical examination without abnormal findings: Secondary | ICD-10-CM | POA: Diagnosis not present

## 2021-11-27 DIAGNOSIS — E663 Overweight: Secondary | ICD-10-CM | POA: Diagnosis not present

## 2021-11-27 DIAGNOSIS — Z6829 Body mass index (BMI) 29.0-29.9, adult: Secondary | ICD-10-CM | POA: Diagnosis not present

## 2022-02-27 DIAGNOSIS — E1121 Type 2 diabetes mellitus with diabetic nephropathy: Secondary | ICD-10-CM | POA: Diagnosis not present

## 2022-02-27 DIAGNOSIS — G2581 Restless legs syndrome: Secondary | ICD-10-CM | POA: Diagnosis not present

## 2022-02-27 DIAGNOSIS — E7800 Pure hypercholesterolemia, unspecified: Secondary | ICD-10-CM | POA: Diagnosis not present

## 2022-02-27 DIAGNOSIS — Z9181 History of falling: Secondary | ICD-10-CM | POA: Diagnosis not present

## 2022-02-27 DIAGNOSIS — I10 Essential (primary) hypertension: Secondary | ICD-10-CM | POA: Diagnosis not present

## 2022-02-27 DIAGNOSIS — Z8673 Personal history of transient ischemic attack (TIA), and cerebral infarction without residual deficits: Secondary | ICD-10-CM | POA: Diagnosis not present

## 2022-02-27 DIAGNOSIS — K219 Gastro-esophageal reflux disease without esophagitis: Secondary | ICD-10-CM | POA: Diagnosis not present

## 2022-03-11 DIAGNOSIS — Z23 Encounter for immunization: Secondary | ICD-10-CM | POA: Diagnosis not present

## 2022-03-25 DIAGNOSIS — H524 Presbyopia: Secondary | ICD-10-CM | POA: Diagnosis not present

## 2022-03-25 DIAGNOSIS — H25813 Combined forms of age-related cataract, bilateral: Secondary | ICD-10-CM | POA: Diagnosis not present

## 2022-04-15 DIAGNOSIS — L821 Other seborrheic keratosis: Secondary | ICD-10-CM | POA: Diagnosis not present

## 2022-04-15 DIAGNOSIS — L578 Other skin changes due to chronic exposure to nonionizing radiation: Secondary | ICD-10-CM | POA: Diagnosis not present

## 2022-04-15 DIAGNOSIS — D485 Neoplasm of uncertain behavior of skin: Secondary | ICD-10-CM | POA: Diagnosis not present

## 2022-04-15 DIAGNOSIS — L570 Actinic keratosis: Secondary | ICD-10-CM | POA: Diagnosis not present

## 2022-05-27 DIAGNOSIS — Z6828 Body mass index (BMI) 28.0-28.9, adult: Secondary | ICD-10-CM | POA: Diagnosis not present

## 2022-05-27 DIAGNOSIS — I10 Essential (primary) hypertension: Secondary | ICD-10-CM | POA: Diagnosis not present

## 2022-05-27 DIAGNOSIS — E663 Overweight: Secondary | ICD-10-CM | POA: Diagnosis not present

## 2022-05-27 DIAGNOSIS — E1121 Type 2 diabetes mellitus with diabetic nephropathy: Secondary | ICD-10-CM | POA: Diagnosis not present

## 2022-05-27 DIAGNOSIS — E7800 Pure hypercholesterolemia, unspecified: Secondary | ICD-10-CM | POA: Diagnosis not present

## 2022-05-27 DIAGNOSIS — Z8673 Personal history of transient ischemic attack (TIA), and cerebral infarction without residual deficits: Secondary | ICD-10-CM | POA: Diagnosis not present

## 2022-05-27 DIAGNOSIS — K219 Gastro-esophageal reflux disease without esophagitis: Secondary | ICD-10-CM | POA: Diagnosis not present

## 2022-07-04 DIAGNOSIS — M79602 Pain in left arm: Secondary | ICD-10-CM | POA: Diagnosis not present

## 2022-07-04 DIAGNOSIS — M79622 Pain in left upper arm: Secondary | ICD-10-CM | POA: Diagnosis not present

## 2022-08-13 DIAGNOSIS — C44319 Basal cell carcinoma of skin of other parts of face: Secondary | ICD-10-CM | POA: Diagnosis not present

## 2022-08-13 DIAGNOSIS — L570 Actinic keratosis: Secondary | ICD-10-CM | POA: Diagnosis not present

## 2022-08-13 DIAGNOSIS — L820 Inflamed seborrheic keratosis: Secondary | ICD-10-CM | POA: Diagnosis not present

## 2022-08-28 DIAGNOSIS — I10 Essential (primary) hypertension: Secondary | ICD-10-CM | POA: Diagnosis not present

## 2022-08-28 DIAGNOSIS — E1169 Type 2 diabetes mellitus with other specified complication: Secondary | ICD-10-CM | POA: Diagnosis not present

## 2022-08-28 DIAGNOSIS — K219 Gastro-esophageal reflux disease without esophagitis: Secondary | ICD-10-CM | POA: Diagnosis not present

## 2022-08-28 DIAGNOSIS — E663 Overweight: Secondary | ICD-10-CM | POA: Diagnosis not present

## 2022-08-28 DIAGNOSIS — E7800 Pure hypercholesterolemia, unspecified: Secondary | ICD-10-CM | POA: Diagnosis not present

## 2022-08-28 DIAGNOSIS — Z8673 Personal history of transient ischemic attack (TIA), and cerebral infarction without residual deficits: Secondary | ICD-10-CM | POA: Diagnosis not present

## 2022-08-28 DIAGNOSIS — Z6829 Body mass index (BMI) 29.0-29.9, adult: Secondary | ICD-10-CM | POA: Diagnosis not present

## 2022-11-27 DIAGNOSIS — I10 Essential (primary) hypertension: Secondary | ICD-10-CM | POA: Diagnosis not present

## 2022-11-27 DIAGNOSIS — E7800 Pure hypercholesterolemia, unspecified: Secondary | ICD-10-CM | POA: Diagnosis not present

## 2022-11-27 DIAGNOSIS — K219 Gastro-esophageal reflux disease without esophagitis: Secondary | ICD-10-CM | POA: Diagnosis not present

## 2022-11-27 DIAGNOSIS — E1169 Type 2 diabetes mellitus with other specified complication: Secondary | ICD-10-CM | POA: Diagnosis not present

## 2022-11-27 DIAGNOSIS — N182 Chronic kidney disease, stage 2 (mild): Secondary | ICD-10-CM | POA: Diagnosis not present

## 2022-11-27 DIAGNOSIS — Z6829 Body mass index (BMI) 29.0-29.9, adult: Secondary | ICD-10-CM | POA: Diagnosis not present

## 2022-11-27 DIAGNOSIS — Z7902 Long term (current) use of antithrombotics/antiplatelets: Secondary | ICD-10-CM | POA: Diagnosis not present

## 2022-11-27 DIAGNOSIS — E663 Overweight: Secondary | ICD-10-CM | POA: Diagnosis not present

## 2023-02-11 DIAGNOSIS — C4441 Basal cell carcinoma of skin of scalp and neck: Secondary | ICD-10-CM | POA: Diagnosis not present

## 2023-02-11 DIAGNOSIS — L578 Other skin changes due to chronic exposure to nonionizing radiation: Secondary | ICD-10-CM | POA: Diagnosis not present

## 2023-02-11 DIAGNOSIS — L219 Seborrheic dermatitis, unspecified: Secondary | ICD-10-CM | POA: Diagnosis not present

## 2023-02-11 DIAGNOSIS — L570 Actinic keratosis: Secondary | ICD-10-CM | POA: Diagnosis not present

## 2023-02-11 DIAGNOSIS — L821 Other seborrheic keratosis: Secondary | ICD-10-CM | POA: Diagnosis not present

## 2023-03-17 DIAGNOSIS — Z8673 Personal history of transient ischemic attack (TIA), and cerebral infarction without residual deficits: Secondary | ICD-10-CM | POA: Diagnosis not present

## 2023-03-17 DIAGNOSIS — Z96643 Presence of artificial hip joint, bilateral: Secondary | ICD-10-CM | POA: Diagnosis not present

## 2023-03-17 DIAGNOSIS — Z0000 Encounter for general adult medical examination without abnormal findings: Secondary | ICD-10-CM | POA: Diagnosis not present

## 2023-03-17 DIAGNOSIS — G2581 Restless legs syndrome: Secondary | ICD-10-CM | POA: Diagnosis not present

## 2023-03-17 DIAGNOSIS — Z1389 Encounter for screening for other disorder: Secondary | ICD-10-CM | POA: Diagnosis not present

## 2023-03-17 DIAGNOSIS — E7800 Pure hypercholesterolemia, unspecified: Secondary | ICD-10-CM | POA: Diagnosis not present

## 2023-03-17 DIAGNOSIS — I10 Essential (primary) hypertension: Secondary | ICD-10-CM | POA: Diagnosis not present

## 2023-03-17 DIAGNOSIS — K219 Gastro-esophageal reflux disease without esophagitis: Secondary | ICD-10-CM | POA: Diagnosis not present

## 2023-03-17 DIAGNOSIS — F419 Anxiety disorder, unspecified: Secondary | ICD-10-CM | POA: Diagnosis not present

## 2023-03-17 DIAGNOSIS — E1121 Type 2 diabetes mellitus with diabetic nephropathy: Secondary | ICD-10-CM | POA: Diagnosis not present

## 2023-04-21 DIAGNOSIS — H25813 Combined forms of age-related cataract, bilateral: Secondary | ICD-10-CM | POA: Diagnosis not present

## 2023-05-07 DIAGNOSIS — M79671 Pain in right foot: Secondary | ICD-10-CM | POA: Diagnosis not present

## 2023-05-07 DIAGNOSIS — L600 Ingrowing nail: Secondary | ICD-10-CM | POA: Diagnosis not present

## 2023-05-20 DIAGNOSIS — H25813 Combined forms of age-related cataract, bilateral: Secondary | ICD-10-CM | POA: Diagnosis not present

## 2023-05-20 DIAGNOSIS — H25812 Combined forms of age-related cataract, left eye: Secondary | ICD-10-CM | POA: Diagnosis not present

## 2023-05-20 DIAGNOSIS — Z01818 Encounter for other preprocedural examination: Secondary | ICD-10-CM | POA: Diagnosis not present

## 2023-06-17 DIAGNOSIS — H25812 Combined forms of age-related cataract, left eye: Secondary | ICD-10-CM | POA: Diagnosis not present

## 2023-06-17 DIAGNOSIS — H52222 Regular astigmatism, left eye: Secondary | ICD-10-CM | POA: Diagnosis not present

## 2023-06-17 DIAGNOSIS — H353131 Nonexudative age-related macular degeneration, bilateral, early dry stage: Secondary | ICD-10-CM | POA: Diagnosis not present

## 2023-06-17 DIAGNOSIS — H259 Unspecified age-related cataract: Secondary | ICD-10-CM | POA: Diagnosis not present

## 2023-08-11 DIAGNOSIS — L820 Inflamed seborrheic keratosis: Secondary | ICD-10-CM | POA: Diagnosis not present

## 2023-08-11 DIAGNOSIS — L570 Actinic keratosis: Secondary | ICD-10-CM | POA: Diagnosis not present

## 2023-08-11 DIAGNOSIS — L821 Other seborrheic keratosis: Secondary | ICD-10-CM | POA: Diagnosis not present

## 2023-08-11 DIAGNOSIS — L578 Other skin changes due to chronic exposure to nonionizing radiation: Secondary | ICD-10-CM | POA: Diagnosis not present

## 2023-08-11 DIAGNOSIS — C44219 Basal cell carcinoma of skin of left ear and external auricular canal: Secondary | ICD-10-CM | POA: Diagnosis not present

## 2023-08-13 DIAGNOSIS — I10 Essential (primary) hypertension: Secondary | ICD-10-CM | POA: Diagnosis not present

## 2023-08-13 DIAGNOSIS — Z6829 Body mass index (BMI) 29.0-29.9, adult: Secondary | ICD-10-CM | POA: Diagnosis not present

## 2023-08-13 DIAGNOSIS — E559 Vitamin D deficiency, unspecified: Secondary | ICD-10-CM | POA: Diagnosis not present

## 2023-08-13 DIAGNOSIS — N182 Chronic kidney disease, stage 2 (mild): Secondary | ICD-10-CM | POA: Diagnosis not present

## 2023-08-13 DIAGNOSIS — E7800 Pure hypercholesterolemia, unspecified: Secondary | ICD-10-CM | POA: Diagnosis not present

## 2023-08-13 DIAGNOSIS — E1169 Type 2 diabetes mellitus with other specified complication: Secondary | ICD-10-CM | POA: Diagnosis not present

## 2023-08-13 DIAGNOSIS — E538 Deficiency of other specified B group vitamins: Secondary | ICD-10-CM | POA: Diagnosis not present

## 2023-08-13 DIAGNOSIS — Z7902 Long term (current) use of antithrombotics/antiplatelets: Secondary | ICD-10-CM | POA: Diagnosis not present

## 2023-08-13 DIAGNOSIS — K219 Gastro-esophageal reflux disease without esophagitis: Secondary | ICD-10-CM | POA: Diagnosis not present

## 2023-08-13 DIAGNOSIS — E663 Overweight: Secondary | ICD-10-CM | POA: Diagnosis not present

## 2023-09-16 DIAGNOSIS — L84 Corns and callosities: Secondary | ICD-10-CM | POA: Diagnosis not present

## 2023-09-16 DIAGNOSIS — B351 Tinea unguium: Secondary | ICD-10-CM | POA: Diagnosis not present

## 2023-09-16 DIAGNOSIS — I739 Peripheral vascular disease, unspecified: Secondary | ICD-10-CM | POA: Diagnosis not present

## 2023-09-16 DIAGNOSIS — M792 Neuralgia and neuritis, unspecified: Secondary | ICD-10-CM | POA: Diagnosis not present

## 2023-09-16 DIAGNOSIS — M2012 Hallux valgus (acquired), left foot: Secondary | ICD-10-CM | POA: Diagnosis not present

## 2023-11-12 DIAGNOSIS — Z7902 Long term (current) use of antithrombotics/antiplatelets: Secondary | ICD-10-CM | POA: Diagnosis not present

## 2023-11-12 DIAGNOSIS — I10 Essential (primary) hypertension: Secondary | ICD-10-CM | POA: Diagnosis not present

## 2023-11-12 DIAGNOSIS — E1169 Type 2 diabetes mellitus with other specified complication: Secondary | ICD-10-CM | POA: Diagnosis not present

## 2023-11-12 DIAGNOSIS — N182 Chronic kidney disease, stage 2 (mild): Secondary | ICD-10-CM | POA: Diagnosis not present

## 2023-11-12 DIAGNOSIS — K219 Gastro-esophageal reflux disease without esophagitis: Secondary | ICD-10-CM | POA: Diagnosis not present

## 2023-11-12 DIAGNOSIS — E663 Overweight: Secondary | ICD-10-CM | POA: Diagnosis not present

## 2023-11-12 DIAGNOSIS — Z6829 Body mass index (BMI) 29.0-29.9, adult: Secondary | ICD-10-CM | POA: Diagnosis not present

## 2023-11-12 DIAGNOSIS — E7800 Pure hypercholesterolemia, unspecified: Secondary | ICD-10-CM | POA: Diagnosis not present

## 2023-12-04 ENCOUNTER — Ambulatory Visit (INDEPENDENT_AMBULATORY_CARE_PROVIDER_SITE_OTHER)

## 2023-12-04 ENCOUNTER — Ambulatory Visit: Admitting: Podiatry

## 2023-12-04 DIAGNOSIS — M21622 Bunionette of left foot: Secondary | ICD-10-CM

## 2023-12-04 DIAGNOSIS — M2010 Hallux valgus (acquired), unspecified foot: Secondary | ICD-10-CM | POA: Insufficient documentation

## 2023-12-04 DIAGNOSIS — M79674 Pain in right toe(s): Secondary | ICD-10-CM | POA: Diagnosis not present

## 2023-12-04 DIAGNOSIS — L909 Atrophic disorder of skin, unspecified: Secondary | ICD-10-CM | POA: Diagnosis not present

## 2023-12-04 DIAGNOSIS — M79675 Pain in left toe(s): Secondary | ICD-10-CM

## 2023-12-04 DIAGNOSIS — M21621 Bunionette of right foot: Secondary | ICD-10-CM

## 2023-12-04 DIAGNOSIS — M7752 Other enthesopathy of left foot: Secondary | ICD-10-CM | POA: Diagnosis not present

## 2023-12-04 DIAGNOSIS — I739 Peripheral vascular disease, unspecified: Secondary | ICD-10-CM

## 2023-12-04 DIAGNOSIS — L84 Corns and callosities: Secondary | ICD-10-CM

## 2023-12-04 DIAGNOSIS — B351 Tinea unguium: Secondary | ICD-10-CM

## 2023-12-04 DIAGNOSIS — M7751 Other enthesopathy of right foot: Secondary | ICD-10-CM

## 2023-12-04 NOTE — Progress Notes (Signed)
 Subjective:  Patient ID: Kimberly Lester, female    DOB: 02-22-1931,  MRN: [id number]  Kimberly Lester presents to clinic today for:  Chief Complaint  Patient presents with   Foot Pain    Left foot pain. Callus on medial side near 1st hallux and on medial side of 1st hallux. She also reports pain in the bottom of her left foot after standing for long periods.    RFC    Not diabetic. Takes Plavix . Has right 1st toenail that is elongated and thickened. Patient would like that addressed today if possible Right 1st toe nail has been removed before due to an ingrown.    Patient notes nails are thick and elongated, causing pain in shoe gear when ambulating.  She has painful corns bilateral submet 5.  She also notes a large callus submet 1 left foot.  It appears that the patient was seen for nail and callus care on 09/16/2023 by an external physician.  PCP is Gayl Males, MD. last seen 08/13/2023  Past Medical History:  Diagnosis Date   GERD (gastroesophageal reflux disease)    Hypercholesterolemia    Hypertension    Osteoarthritis    Restless leg syndrome    Stroke ([facility]) 06/16/2011   some balance issues, finger numbness   TIA (transient ischemic attack) 07/20/2011   Allergies  Allergen Reactions   Lorazepam Other (See Comments)    makes her crazy   Oxycodone-Acetaminophen  Nausea Only   Percocet [Oxycodone-Acetaminophen ] Nausea And Vomiting    Objective:  Kimberly Lester is a pleasant 89 y.o. female in NAD. AAO x 3.  Vascular Examination: Patient has palpable DP pulse, absent PT pulse bilateral.  Delayed capillary refill bilateral toes.  Sparse digital hair bilateral.  Proximal to distal cooling WNL bilateral.    Dermatological Examination: Interspaces are clear with no open lesions noted bilateral.  Skin is shiny and atrophic bilateral.  Nails are 3-36mm thick, with yellowish/brown discoloration, subungual debris and distal onycholysis x10.  There is pain with compression of nails  x10.  There are hyperkeratotic lesions noted bilateral submet 5 and left submet 1.  Loss of fat pad bilateral heels and ball of foot.  Orthopedic examination: There are plantarflexed 1st and 5th metatarsals bilateral.  Bony prominence on the plantar lateral aspect of the fifth MPJ consistent with tailor's bunion bilateral.  Radiology exam: 3 weightbearing radiographs were obtained of the left foot which revealed a bony enlargement at the lateral aspect of the fifth metatarsal head consistent with tailor's bunion, medial angulation of all the lesser toes at the level of the MPJ.,  Joint space narrowing at the first MPJ.  Small posterior calcaneal spur noted.  Patient qualifies for at-risk foot care because of PVD.  Assessment/Plan: 1. Pain due to onychomycosis of toenails of both feet   2. Capsulitis of metatarsophalangeal (MTP) joint of left foot   3. Capsulitis of metatarsophalangeal (MTP) joint of right foot   4. Corns   5. Tailor's bunion of both feet   6. Fat pad atrophy of foot   7. PVD (peripheral vascular disease) ([facility])    Mycotic nails x10 were sharply debrided with sterile nail nippers and power debriding burr to decrease bulk and length.  Hyperkeratotic lesions x 3 were shaved with #312 blade.  Return in about 3 months (around 03/05/2024) for RFC.   Awanda CHARM Imperial, DPM, FACFAS [facility]     [address]  [place], [place] [address]                Office [phone]  Fax [phone]

## 2023-12-25 DIAGNOSIS — H01022 Squamous blepharitis right lower eyelid: Secondary | ICD-10-CM | POA: Diagnosis not present

## 2023-12-25 DIAGNOSIS — H01025 Squamous blepharitis left lower eyelid: Secondary | ICD-10-CM | POA: Diagnosis not present

## 2024-02-02 DIAGNOSIS — H0015 Chalazion left lower eyelid: Secondary | ICD-10-CM | POA: Diagnosis not present

## 2024-02-12 DIAGNOSIS — N182 Chronic kidney disease, stage 2 (mild): Secondary | ICD-10-CM | POA: Diagnosis not present

## 2024-02-12 DIAGNOSIS — Z23 Encounter for immunization: Secondary | ICD-10-CM | POA: Diagnosis not present

## 2024-02-12 DIAGNOSIS — E1169 Type 2 diabetes mellitus with other specified complication: Secondary | ICD-10-CM | POA: Diagnosis not present

## 2024-02-12 DIAGNOSIS — I10 Essential (primary) hypertension: Secondary | ICD-10-CM | POA: Diagnosis not present

## 2024-02-12 DIAGNOSIS — E7800 Pure hypercholesterolemia, unspecified: Secondary | ICD-10-CM | POA: Diagnosis not present

## 2024-02-12 DIAGNOSIS — E663 Overweight: Secondary | ICD-10-CM | POA: Diagnosis not present

## 2024-02-12 DIAGNOSIS — Z6828 Body mass index (BMI) 28.0-28.9, adult: Secondary | ICD-10-CM | POA: Diagnosis not present

## 2024-02-12 DIAGNOSIS — Z7902 Long term (current) use of antithrombotics/antiplatelets: Secondary | ICD-10-CM | POA: Diagnosis not present

## 2024-02-16 DIAGNOSIS — L578 Other skin changes due to chronic exposure to nonionizing radiation: Secondary | ICD-10-CM | POA: Diagnosis not present

## 2024-02-16 DIAGNOSIS — L570 Actinic keratosis: Secondary | ICD-10-CM | POA: Diagnosis not present

## 2024-02-16 DIAGNOSIS — L821 Other seborrheic keratosis: Secondary | ICD-10-CM | POA: Diagnosis not present

## 2024-04-06 DIAGNOSIS — Z0000 Encounter for general adult medical examination without abnormal findings: Secondary | ICD-10-CM | POA: Diagnosis not present

## 2024-04-06 DIAGNOSIS — Z6829 Body mass index (BMI) 29.0-29.9, adult: Secondary | ICD-10-CM | POA: Diagnosis not present

## 2024-04-06 DIAGNOSIS — Z1389 Encounter for screening for other disorder: Secondary | ICD-10-CM | POA: Diagnosis not present

## 2024-04-06 DIAGNOSIS — E663 Overweight: Secondary | ICD-10-CM | POA: Diagnosis not present
